# Patient Record
Sex: Female | Born: 1995 | Race: Black or African American | Hispanic: No | Marital: Single | Smoking: Never smoker
Health system: Southern US, Community
[De-identification: ages and names within clinical notes are randomized; demographics above are authoritative.]

## PROBLEM LIST (undated history)

## (undated) ENCOUNTER — Inpatient Hospital Stay (HOSPITAL_COMMUNITY): Payer: Self-pay

## (undated) DIAGNOSIS — B999 Unspecified infectious disease: Secondary | ICD-10-CM

## (undated) DIAGNOSIS — R112 Nausea with vomiting, unspecified: Secondary | ICD-10-CM

## (undated) DIAGNOSIS — Z9889 Other specified postprocedural states: Secondary | ICD-10-CM

## (undated) HISTORY — PX: WISDOM TOOTH EXTRACTION: SHX21

---
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
## 2014-05-31 ENCOUNTER — Emergency Department (HOSPITAL_COMMUNITY)
Admission: EM | Admit: 2014-05-31 | Discharge: 2014-05-31 | Discharge status: Absent without leave | Payer: Self-pay | Source: Home / Self Care

## 2014-08-02 ENCOUNTER — Inpatient Hospital Stay (HOSPITAL_COMMUNITY): Payer: Medicaid Other

## 2014-08-02 ENCOUNTER — Inpatient Hospital Stay (HOSPITAL_COMMUNITY)
Admission: AD | Admit: 2014-08-02 | Discharge: 2014-08-02 | Disposition: A | Discharge status: Home / Self Care | Payer: Medicaid Other | Source: Ambulatory Visit | Attending: Obstetrics & Gynecology | Admitting: Obstetrics & Gynecology

## 2014-08-02 ENCOUNTER — Encounter (HOSPITAL_COMMUNITY): Payer: Self-pay | Admitting: *Deleted

## 2014-08-02 DIAGNOSIS — Z3A01 Less than 8 weeks gestation of pregnancy: Secondary | ICD-10-CM | POA: Diagnosis not present

## 2014-08-02 DIAGNOSIS — O9989 Other specified diseases and conditions complicating pregnancy, childbirth and the puerperium: Secondary | ICD-10-CM | POA: Diagnosis not present

## 2014-08-02 DIAGNOSIS — R109 Unspecified abdominal pain: Secondary | ICD-10-CM

## 2014-08-02 DIAGNOSIS — O26899 Other specified pregnancy related conditions, unspecified trimester: Secondary | ICD-10-CM

## 2014-08-02 LAB — URINALYSIS, ROUTINE W REFLEX MICROSCOPIC
Bilirubin Urine: NEGATIVE
GLUCOSE, UA: NEGATIVE mg/dL
Hgb urine dipstick: NEGATIVE
Ketones, ur: NEGATIVE mg/dL
LEUKOCYTES UA: NEGATIVE
Nitrite: NEGATIVE
PH: 7 (ref 5.0–8.0)
Protein, ur: NEGATIVE mg/dL
SPECIFIC GRAVITY, URINE: 1.025 (ref 1.005–1.030)
Urobilinogen, UA: 0.2 mg/dL (ref 0.0–1.0)

## 2014-08-02 LAB — CBC
HCT: 34.5 % — ABNORMAL LOW (ref 36.0–46.0)
HEMOGLOBIN: 12 g/dL (ref 12.0–15.0)
MCH: 29.3 pg (ref 26.0–34.0)
MCHC: 34.8 g/dL (ref 30.0–36.0)
MCV: 84.1 fL (ref 78.0–100.0)
PLATELETS: 257 10*3/uL (ref 150–400)
RBC: 4.1 MIL/uL (ref 3.87–5.11)
RDW: 12.6 % (ref 11.5–15.5)
WBC: 6.7 10*3/uL (ref 4.0–10.5)

## 2014-08-02 LAB — WET PREP, GENITAL
TRICH WET PREP: NONE SEEN
Yeast Wet Prep HPF POC: NONE SEEN

## 2014-08-02 LAB — POCT PREGNANCY, URINE: PREG TEST UR: POSITIVE — AB

## 2014-08-02 LAB — ABO/RH: ABO/RH(D): O POS

## 2014-08-02 LAB — HIV ANTIBODY (ROUTINE TESTING W REFLEX): HIV 1&2 Ab, 4th Generation: NONREACTIVE

## 2014-08-02 LAB — HCG, QUANTITATIVE, PREGNANCY: HCG, BETA CHAIN, QUANT, S: 4816 m[IU]/mL — AB (ref <5)

## 2014-08-02 MED ORDER — PRENATAL PLUS 27-1 MG PO TABS: 1.0000 | ORAL_TABLET | Freq: Every day | ORAL | Status: DC

## 2014-08-02 NOTE — MAU Note (Signed)
Patient states her last Depo injection was in June, last period was 9-20. Has been having abdominal cramping for about one week. Slight vaginal discharge, no bleeding, nausea or vomiting.

## 2014-08-02 NOTE — MAU Provider Note (Signed)
Chief Complaint: Abdominal Cramping; Possible Pregnancy; and Vaginal Discharge  Initial contact at 1700.   SUBJECTIVE HPI: Rachel Aguirre is a 18 y.o. G1 at 10612w2d by LMP who presents with 2 wk hx of intermittent suprapubic abdominal cramping pain. Pain is like menstrual cramping. Not having pain now. No vaginal bleeding. Denies dysuria, urgency heamturia. No irritative vaginal discharge.   History reviewed. No pertinent past medical history. OB History  No data available   History reviewed. No pertinent past surgical history. History   Social History  . Marital Status: Single    Spouse Name: N/A    Number of Children: N/A  . Years of Education: N/A   Occupational History  . Not on file.   Social History Main Topics  . Smoking status: Never Smoker   . Smokeless tobacco: Not on file  . Alcohol Use: No  . Drug Use: No  . Sexual Activity: Yes    Birth Control/ Protection: None   Other Topics Concern  . Not on file   Social History Narrative   No current facility-administered medications on file prior to encounter.   Current Outpatient Prescriptions on File Prior to Encounter  Medication Sig Dispense Refill  . ibuprofen (ADVIL,MOTRIN) 600 MG tablet Take 1 tablet (600 mg total) by mouth every 6 (six) hours as needed for pain. 20 tablet 0   No Known Allergies  ROS: Pertinent items in HPI  OBJECTIVE Blood pressure 118/66, pulse 94, temperature 98.6 F (37 C), temperature source Oral, resp. rate 16, height 5\' 5"  (1.651 m), weight 76.658 kg (169 lb), last menstrual period 06/12/2014, SpO2 100 %. GENERAL: Well-developed, well-nourished female in no acute distress.  HEENT: Normocephalic HEART: normal rate RESP: normal effort ABDOMEN: Soft, non-tender EXTREMITIES: Nontender, no edema NEURO: Alert and oriented SPECULUM EXAM: NEFG, physiologic discharge, no blood noted, cervix clean BIMANUAL: cervix L/C; uterus retroverted, no adnexal tenderness or masses  LAB  RESULTS Results for orders placed or performed during the hospital encounter of 08/02/14 (from the past 24 hour(s))  Pregnancy, urine POC     Status: Abnormal   Collection Time: 08/02/14  4:56 PM  Result Value Ref Range   Preg Test, Ur POSITIVE (A) NEGATIVE    IMAGING No results found. [place] Ob Comp Less 14 Wks  08/02/2014   CLINICAL DATA:  Abdominal pain, flank pain  EXAM: OBSTETRIC <14 WK US AND TRANSVAGINAL OB US  TECHNIQUE: Both transabdominal and transvaginal ultrasound examinations were performed for complete evaluation of the gestation as well as the maternal uterus, adnexal regions, and pelvic cul-de-sac. Transvaginal technique was performed to assess early pregnancy.  COMPARISON:  None.  FINDINGS: Intrauterine gestational sac: Visualized/normal in shape.  Yolk sac:  Visualized.  Embryo:  Not visualized  SD: 7.2 mm   5 w   3  d   US EDC: 04/01/2015  Maternal uterus/adnexae: There is a small subchorionic hemorrhage. Bilateral ovaries are normal in size and echogenicity. The right ovary measures 3.8 x 2.9 x 2.8 cm. The left ovary measures 3.7 x 2.3 x 1.5 cm. There is a small amount of pelvic free fluid.  IMPRESSION: 1. Intrauterine gestational sac dating pregnancy at 5 weeks 3 days. No fetal pole identified. Probable early intrauterine gestational sac, but no fetal pole, or cardiac activity yet visualized. Recommend follow-up quantitative B-HCG levels and follow-up US in 14 days to confirm and assess viability. This recommendation follows SRU consensus guidelines: Diagnostic Criteria for Nonviable Pregnancy Early in the First Trimester. N Engl J  Med 2013; 161:0960-45369:1443-51. 2. Small subchorionic hemorrhage. Attention on follow-up examination is recommended.   Electronically Signed   By: Elige KoHetal  Patel   On: 08/02/2014 18:22   [place] Ob Transvaginal  08/02/2014   CLINICAL DATA:  Abdominal pain, flank pain  EXAM: OBSTETRIC <14 WK US AND TRANSVAGINAL OB US  TECHNIQUE: Both transabdominal and transvaginal  ultrasound examinations were performed for complete evaluation of the gestation as well as the maternal uterus, adnexal regions, and pelvic cul-de-sac. Transvaginal technique was performed to assess early pregnancy.  COMPARISON:  None.  FINDINGS: Intrauterine gestational sac: Visualized/normal in shape.  Yolk sac:  Visualized.  Embryo:  Not visualized  SD: 7.2 mm   5 w   3  d   US EDC: 04/01/2015  Maternal uterus/adnexae: There is a small subchorionic hemorrhage. Bilateral ovaries are normal in size and echogenicity. The right ovary measures 3.8 x 2.9 x 2.8 cm. The left ovary measures 3.7 x 2.3 x 1.5 cm. There is a small amount of pelvic free fluid.  IMPRESSION: 1. Intrauterine gestational sac dating pregnancy at 5 weeks 3 days. No fetal pole identified. Probable early intrauterine gestational sac, but no fetal pole, or cardiac activity yet visualized. Recommend follow-up quantitative B-HCG levels and follow-up US in 14 days to confirm and assess viability. This recommendation follows SRU consensus guidelines: Diagnostic Criteria for Nonviable Pregnancy Early in the First Trimester. Malva Limes Engl J Med 2013; 409:8119-14; 369:1443-51. 2. Small subchorionic hemorrhage. Attention on follow-up examination is recommended.   Electronically Signed   By: Elige KoHetal  Patel   On: 08/02/2014 18:22   MAU COURSE  ASSESSMENT 1. Abdominal pain affecting pregnancy   IUP, viability not yet determined  PLAN Discharge home    Medication List    STOP taking these medications        CALCIUM PO     ibuprofen 600 MG tablet  Commonly known as:  ADVIL,MOTRIN      TAKE these medications        prenatal vitamin w/FE, FA 27-1 MG Tabs tablet  Take 1 tablet by mouth daily.      Pregnancy precautions given Pregnancy verification letter, list of providers, MPW information given  Danae OrleansDeirdre C Tylesha Gibeault, CNM 08/02/2014  5:02 PM

## 2014-08-02 NOTE — Discharge Instructions (Signed)
Abdominal Pain During Pregnancy °Abdominal pain is common in pregnancy. Most of the time, it does not cause harm. There are many causes of abdominal pain. Some causes are more serious than others. Some of the causes of abdominal pain in pregnancy are easily diagnosed. Occasionally, the diagnosis takes time to understand. Other times, the cause is not determined. Abdominal pain can be a sign that something is very wrong with the pregnancy, or the pain may have nothing to do with the pregnancy at all. For this reason, always tell your health care provider if you have any abdominal discomfort. °HOME CARE INSTRUCTIONS  °Monitor your abdominal pain for any changes. The following actions may help to alleviate any discomfort you are experiencing: °· Do not have sexual intercourse or put anything in your vagina until your symptoms go away completely. °· Get plenty of rest until your pain improves. °· Drink clear fluids if you feel nauseous. Avoid solid food as long as you are uncomfortable or nauseous. °· Only take over-the-counter or prescription medicine as directed by your health care provider. °· Keep all follow-up appointments with your health care provider. °SEEK IMMEDIATE MEDICAL CARE IF: °· You are bleeding, leaking fluid, or passing tissue from the vagina. °· You have increasing pain or cramping. °· You have persistent vomiting. °· You have painful or bloody urination. °· You have a fever. °· You notice a decrease in your baby's movements. °· You have extreme weakness or feel faint. °· You have shortness of breath, with or without abdominal pain. °· You develop a severe headache with abdominal pain. °· You have abnormal vaginal discharge with abdominal pain. °· You have persistent diarrhea. °· You have abdominal pain that continues even after rest, or gets worse. °MAKE SURE YOU:  °· Understand these instructions. °· Will watch your condition. °· Will get help right away if you are not doing well or get  worse. °Document Released: 09/09/2005 Document Revised: 06/30/2013 Document Reviewed: 04/08/2013 °ExitCare® Patient Information ©2015 [employer]. This information is not intended to replace advice given to you by your health care provider. Make sure you discuss any questions you have with your health care provider. °Prenatal Care Providers °[facility]    [facility] ° Phone- [phone]     Phone: [phone] °         °[facility]                      [facility] ° @[place]     Phone: [phone] ° Phone: [phone] °        [facility] °[facility]     Phone: [phone] ° Phone: [phone]   °        [facility] °[facility] [place]                hone: [phone] ° Phone: [phone] °        [facility] °Dr. Bernard Marshall      Phone: [phone] ° Phone: [phone] °        [facility] °[facility]                Phone: [phone] ° Women’s [facility]  ° Phone:[phone]    [facility] ([place]) °         Phone: [phone] °[facility] °  Phone: [phone] ° °    ________________________________________ ° ° ° ° °To schedule your Maternity Eligibility Appointment, please call [phone].  When you arrive for your appointment you must bring the following items or information listed below.  Your appointment will be rescheduled if you do not have these items or are 15 minutes late. °If currently receiving Medicaid, you MUST bring: °1. Medicaid Card °2. Social Security Card °3. Picture ID °4. Proof of Pregnancy °5. Verification of current address if the address on Medicaid card is incorrect "postmarked mail" °If not receiving Medicaid, you MUST bring: °1. Social Security Card °2. Picture ID °3. Birth Certificate (if available) Passport or *Green Card °4. Proof of Pregnancy °5. Verification of current address "postmarked mail" for each  income presented. °6. Verification of insurance coverage, if any °7. Check stubs from each employer for the previous month (if unable to present check stub  °for each week, we will accept check stub for the first and last week ill the same month.) If you can't locate check stubs, you must bring a letter from the employer(s) and it must have the following information on letterhead, typed, in English: °o name of company °o company telephone number °o how long been with the company, if less than one month °o how much person earns per hour °o how many hours per week work °o the gross pay the person earned for the previous month °If you are 19 years old or less, you do not have to bring proof of income unless you work or live with the father of the baby and at that time we will need proof of income from you and/or the father of the baby. °Green Card recipients are eligible for Medicaid for Pregnant Women (MPW) ° ° °

## 2014-08-03 LAB — GC/CHLAMYDIA PROBE AMP
CT Probe RNA: NEGATIVE
GC Probe RNA: NEGATIVE

## 2014-09-29 LAB — OB RESULTS CONSOLE GC/CHLAMYDIA
Chlamydia: NEGATIVE
GC PROBE AMP, GENITAL: NEGATIVE

## 2014-09-29 LAB — OB RESULTS CONSOLE RPR: RPR: NONREACTIVE

## 2014-09-29 LAB — OB RESULTS CONSOLE HEPATITIS B SURFACE ANTIGEN: HEP B S AG: NEGATIVE

## 2014-09-29 LAB — OB RESULTS CONSOLE ANTIBODY SCREEN: ANTIBODY SCREEN: NEGATIVE

## 2014-09-29 LAB — OB RESULTS CONSOLE RUBELLA ANTIBODY, IGM: Rubella: NON-IMMUNE/NOT IMMUNE

## 2014-09-29 LAB — OB RESULTS CONSOLE HIV ANTIBODY (ROUTINE TESTING): HIV: NONREACTIVE

## 2015-03-01 LAB — OB RESULTS CONSOLE GBS: STREP GROUP B AG: POSITIVE

## 2015-03-26 ENCOUNTER — Inpatient Hospital Stay (HOSPITAL_COMMUNITY)
Admission: AD | Admit: 2015-03-26 | Discharge: 2015-03-26 | Disposition: A | Discharge status: Home / Self Care | Payer: Medicaid Other | Source: Ambulatory Visit | Attending: Obstetrics | Admitting: Obstetrics

## 2015-03-26 ENCOUNTER — Encounter (HOSPITAL_COMMUNITY): Payer: Self-pay | Admitting: *Deleted

## 2015-03-26 DIAGNOSIS — Z3A38 38 weeks gestation of pregnancy: Secondary | ICD-10-CM | POA: Diagnosis present

## 2015-03-26 DIAGNOSIS — Z8249 Family history of ischemic heart disease and other diseases of the circulatory system: Secondary | ICD-10-CM

## 2015-03-26 DIAGNOSIS — O99824 Streptococcus B carrier state complicating childbirth: Secondary | ICD-10-CM | POA: Diagnosis present

## 2015-03-26 NOTE — MAU Note (Signed)
Pt reports contractions all day today, now about 10 minutes apart. Some pink discharge x 2 today.

## 2015-03-26 NOTE — MAU Note (Signed)
Dr. Clearance CootsHarper looking at strip as patient has had variables. Dr. Clearance CootsHarper aware that patient is now 1 cm and states that pt may go home. Strip has been reviewed by Dr. Clearance CootsHarper.

## 2015-03-26 NOTE — Discharge Instructions (Signed)
Braxton Hicks Contractions °Contractions of the uterus can occur throughout pregnancy. Contractions are not always a sign that you are in labor.  °WHAT ARE BRAXTON HICKS CONTRACTIONS?  °Contractions that occur before labor are called Braxton Hicks contractions, or false labor. Toward the end of pregnancy (32-34 weeks), these contractions can develop more often and may become more forceful. This is not true labor because these contractions do not result in opening (dilatation) and thinning of the cervix. They are sometimes difficult to tell apart from true labor because these contractions can be forceful and people have different pain tolerances. You should not feel embarrassed if you go to the hospital with false labor. Sometimes, the only way to tell if you are in true labor is for your health care provider to look for changes in the cervix. °If there are no prenatal problems or other health problems associated with the pregnancy, it is completely safe to be sent home with false labor and await the onset of true labor. °HOW CAN YOU TELL THE DIFFERENCE BETWEEN TRUE AND FALSE LABOR? °False Labor °· The contractions of false labor are usually shorter and not as hard as those of true labor.   °· The contractions are usually irregular.   °· The contractions are often felt in the front of the lower abdomen and in the groin.   °· The contractions may go away when you walk around or change positions while lying down.   °· The contractions get weaker and are shorter lasting as time goes on.   °· The contractions do not usually become progressively stronger, regular, and closer together as with true labor.   °True Labor °· Contractions in true labor last 30-70 seconds, become very regular, usually become more intense, and increase in frequency.   °· The contractions do not go away with walking.   °· The discomfort is usually felt in the top of the uterus and spreads to the lower abdomen and low back.   °· True labor can be  determined by your health care provider with an exam. This will show that the cervix is dilating and getting thinner.   °WHAT TO REMEMBER °· Keep up with your usual exercises and follow other instructions given by your health care provider.   °· Take medicines as directed by your health care provider.   °· Keep your regular prenatal appointments.   °· Eat and drink lightly if you think you are going into labor.   °· If Braxton Hicks contractions are making you uncomfortable:   °¨ Change your position from lying down or resting to walking, or from walking to resting.   °¨ Sit and rest in a tub of warm water.   °¨ Drink 2-3 glasses of water. Dehydration may cause these contractions.   °¨ Do slow and deep breathing several times an hour.   °WHEN SHOULD I SEEK IMMEDIATE MEDICAL CARE? °Seek immediate medical care if: °· Your contractions become stronger, more regular, and closer together.   °· You have fluid leaking or gushing from your vagina.   °· You have a fever.   °· You pass blood-tinged mucus.   °· You have vaginal bleeding.   °· You have continuous abdominal pain.   °· You have low back pain that you never had before.   °· You feel your baby's head pushing down and causing pelvic pressure.   °· Your baby is not moving as much as it used to.   °Document Released: 09/09/2005 Document Revised: 09/14/2013 Document Reviewed: 06/21/2013 °ExitCare® Patient Information ©2015 [employer]. This information is not intended to replace advice given to you by your health care   provider. Make sure you discuss any questions you have with your health care provider. ° °

## 2015-03-27 ENCOUNTER — Inpatient Hospital Stay (HOSPITAL_COMMUNITY): Payer: Medicaid Other | Admitting: Anesthesiology

## 2015-03-27 ENCOUNTER — Encounter (HOSPITAL_COMMUNITY)
Admission: AD | Disposition: A | Discharge status: Home / Self Care | Payer: Self-pay | Source: Ambulatory Visit | Attending: Obstetrics

## 2015-03-27 ENCOUNTER — Encounter (HOSPITAL_COMMUNITY): Payer: Self-pay | Admitting: *Deleted

## 2015-03-27 ENCOUNTER — Inpatient Hospital Stay (HOSPITAL_COMMUNITY)
Admission: AD | Admit: 2015-03-27 | Discharge: 2015-03-30 | DRG: 766 | Disposition: A | Discharge status: Home / Self Care | Payer: Medicaid Other | Source: Ambulatory Visit | Attending: Obstetrics | Admitting: Obstetrics

## 2015-03-27 DIAGNOSIS — O99824 Streptococcus B carrier state complicating childbirth: Secondary | ICD-10-CM | POA: Diagnosis present

## 2015-03-27 DIAGNOSIS — Z3A38 38 weeks gestation of pregnancy: Secondary | ICD-10-CM | POA: Diagnosis present

## 2015-03-27 DIAGNOSIS — Z8249 Family history of ischemic heart disease and other diseases of the circulatory system: Secondary | ICD-10-CM | POA: Diagnosis not present

## 2015-03-27 DIAGNOSIS — O34219 Maternal care for unspecified type scar from previous cesarean delivery: Secondary | ICD-10-CM

## 2015-03-27 LAB — CBC
HCT: 35.3 % — ABNORMAL LOW (ref 36.0–46.0)
Hemoglobin: 11.9 g/dL — ABNORMAL LOW (ref 12.0–15.0)
MCH: 28.3 pg (ref 26.0–34.0)
MCHC: 33.7 g/dL (ref 30.0–36.0)
MCV: 84 fL (ref 78.0–100.0)
PLATELETS: 173 10*3/uL (ref 150–400)
RBC: 4.2 MIL/uL (ref 3.87–5.11)
RDW: 13.8 % (ref 11.5–15.5)
WBC: 10.1 10*3/uL (ref 4.0–10.5)

## 2015-03-27 LAB — TYPE AND SCREEN
ABO/RH(D): O POS
Antibody Screen: NEGATIVE

## 2015-03-27 LAB — RPR: RPR: NONREACTIVE

## 2015-03-27 SURGERY — Surgical Case
Anesthesia: Spinal | Site: Abdomen

## 2015-03-27 MED ORDER — MENTHOL 3 MG MT LOZG: 1.0000 | LOZENGE | OROMUCOSAL | Status: DC | PRN

## 2015-03-27 MED ORDER — MEPERIDINE HCL 25 MG/ML IJ SOLN
6.2500 mg | INTRAMUSCULAR | Status: DC | PRN
Start: 2015-03-27 — End: 2015-03-27

## 2015-03-27 MED ORDER — OXYTOCIN BOLUS FROM INFUSION: 500.0000 mL | INTRAVENOUS | Status: DC

## 2015-03-27 MED ORDER — OXYCODONE-ACETAMINOPHEN 5-325 MG PO TABS: 1.0000 | ORAL_TABLET | ORAL | Status: DC | PRN

## 2015-03-27 MED ORDER — NALBUPHINE HCL 10 MG/ML IJ SOLN
10.0000 mg | Freq: Once | INTRAMUSCULAR | Status: AC
  Administered 2015-03-27: 10 mg via INTRAMUSCULAR
  Filled 2015-03-27: qty 1

## 2015-03-27 MED ORDER — KETOROLAC TROMETHAMINE 30 MG/ML IJ SOLN
INTRAMUSCULAR | Status: AC
  Filled 2015-03-27: qty 1

## 2015-03-27 MED ORDER — TERBUTALINE SULFATE 1 MG/ML IJ SOLN
0.2500 mg | Freq: Once | INTRAMUSCULAR | Status: AC
  Administered 2015-03-27: 0.25 mg via SUBCUTANEOUS

## 2015-03-27 MED ORDER — TERBUTALINE SULFATE 1 MG/ML IJ SOLN
INTRAMUSCULAR | Status: AC
  Filled 2015-03-27: qty 1

## 2015-03-27 MED ORDER — DIPHENHYDRAMINE HCL 25 MG PO CAPS: 25.0000 mg | ORAL_CAPSULE | ORAL | Status: DC | PRN

## 2015-03-27 MED ORDER — BUPIVACAINE IN DEXTROSE 0.75-8.25 % IT SOLN
INTRATHECAL | Status: DC | PRN
  Administered 2015-03-27: 12 mg via INTRATHECAL

## 2015-03-27 MED ORDER — SIMETHICONE 80 MG PO CHEW: 80.0000 mg | CHEWABLE_TABLET | ORAL | Status: DC | PRN

## 2015-03-27 MED ORDER — ONDANSETRON HCL 4 MG/2ML IJ SOLN
INTRAMUSCULAR | Status: AC
  Filled 2015-03-27: qty 2

## 2015-03-27 MED ORDER — OXYTOCIN 40 UNITS IN LACTATED RINGERS INFUSION - SIMPLE MED: 62.5000 mL/h | INTRAVENOUS | Status: AC

## 2015-03-27 MED ORDER — IBUPROFEN 600 MG PO TABS
600.0000 mg | ORAL_TABLET | Freq: Four times a day (QID) | ORAL | Status: DC
  Administered 2015-03-27 – 2015-03-30 (×12): 600 mg via ORAL
  Filled 2015-03-27 (×12): qty 1

## 2015-03-27 MED ORDER — SIMETHICONE 80 MG PO CHEW
80.0000 mg | CHEWABLE_TABLET | ORAL | Status: DC
  Administered 2015-03-28 – 2015-03-30 (×3): 80 mg via ORAL
  Filled 2015-03-27 (×3): qty 1

## 2015-03-27 MED ORDER — NALBUPHINE HCL 10 MG/ML IJ SOLN: 5.0000 mg | Freq: Once | INTRAMUSCULAR | Status: AC | PRN

## 2015-03-27 MED ORDER — DEXTROSE 5 % IV SOLN
5.0000 10*6.[IU] | Freq: Once | INTRAVENOUS | Status: AC
  Administered 2015-03-27: 5 10*6.[IU] via INTRAVENOUS
  Filled 2015-03-27: qty 5

## 2015-03-27 MED ORDER — OXYTOCIN 40 UNITS IN LACTATED RINGERS INFUSION - SIMPLE MED: 62.5000 mL/h | INTRAVENOUS | Status: DC

## 2015-03-27 MED ORDER — DIPHENHYDRAMINE HCL 50 MG/ML IJ SOLN: 12.5000 mg | INTRAMUSCULAR | Status: DC | PRN

## 2015-03-27 MED ORDER — NALOXONE HCL 0.4 MG/ML IJ SOLN: 0.4000 mg | INTRAMUSCULAR | Status: DC | PRN

## 2015-03-27 MED ORDER — SIMETHICONE 80 MG PO CHEW
80.0000 mg | CHEWABLE_TABLET | Freq: Three times a day (TID) | ORAL | Status: DC
  Administered 2015-03-27 – 2015-03-30 (×9): 80 mg via ORAL
  Filled 2015-03-27 (×9): qty 1

## 2015-03-27 MED ORDER — WITCH HAZEL-GLYCERIN EX PADS: 1.0000 "application " | MEDICATED_PAD | CUTANEOUS | Status: DC | PRN

## 2015-03-27 MED ORDER — OXYCODONE-ACETAMINOPHEN 5-325 MG PO TABS
2.0000 | ORAL_TABLET | ORAL | Status: DC | PRN
  Administered 2015-03-28 – 2015-03-30 (×7): 2 via ORAL
  Filled 2015-03-27 (×7): qty 2

## 2015-03-27 MED ORDER — NALBUPHINE HCL 10 MG/ML IJ SOLN: 5.0000 mg | INTRAMUSCULAR | Status: DC | PRN

## 2015-03-27 MED ORDER — PRENATAL MULTIVITAMIN CH
1.0000 | ORAL_TABLET | Freq: Every day | ORAL | Status: DC
  Administered 2015-03-27 – 2015-03-30 (×4): 1 via ORAL
  Filled 2015-03-27 (×4): qty 1

## 2015-03-27 MED ORDER — CEFAZOLIN SODIUM-DEXTROSE 2-3 GM-% IV SOLR
INTRAVENOUS | Status: AC
  Filled 2015-03-27: qty 50

## 2015-03-27 MED ORDER — BUPIVACAINE LIPOSOME 1.3 % IJ SUSP
20.0000 mL | Freq: Once | INTRAMUSCULAR | Status: DC
  Filled 2015-03-27: qty 20

## 2015-03-27 MED ORDER — LACTATED RINGERS IV SOLN
INTRAVENOUS | Status: DC
  Administered 2015-03-27 – 2015-03-28 (×2): via INTRAVENOUS

## 2015-03-27 MED ORDER — IBUPROFEN 600 MG PO TABS: 600.0000 mg | ORAL_TABLET | Freq: Four times a day (QID) | ORAL | Status: DC

## 2015-03-27 MED ORDER — DIPHENHYDRAMINE HCL 25 MG PO CAPS: 25.0000 mg | ORAL_CAPSULE | Freq: Four times a day (QID) | ORAL | Status: DC | PRN

## 2015-03-27 MED ORDER — TETANUS-DIPHTH-ACELL PERTUSSIS 5-2.5-18.5 LF-MCG/0.5 IM SUSP: 0.5000 mL | Freq: Once | INTRAMUSCULAR | Status: DC

## 2015-03-27 MED ORDER — OXYCODONE-ACETAMINOPHEN 5-325 MG PO TABS
1.0000 | ORAL_TABLET | ORAL | Status: DC | PRN
  Administered 2015-03-30: 1 via ORAL
  Filled 2015-03-27: qty 1

## 2015-03-27 MED ORDER — SCOPOLAMINE 1 MG/3DAYS TD PT72
1.0000 | MEDICATED_PATCH | Freq: Once | TRANSDERMAL | Status: AC
  Administered 2015-03-27: 1.5 mg via TRANSDERMAL

## 2015-03-27 MED ORDER — KETOROLAC TROMETHAMINE 30 MG/ML IJ SOLN
30.0000 mg | Freq: Four times a day (QID) | INTRAMUSCULAR | Status: DC | PRN
  Administered 2015-03-27: 30 mg via INTRAMUSCULAR

## 2015-03-27 MED ORDER — PROMETHAZINE HCL 25 MG/ML IJ SOLN
25.0000 mg | Freq: Once | INTRAMUSCULAR | Status: AC
  Administered 2015-03-27: 25 mg via INTRAMUSCULAR
  Filled 2015-03-27: qty 1

## 2015-03-27 MED ORDER — FENTANYL CITRATE (PF) 100 MCG/2ML IJ SOLN
INTRAMUSCULAR | Status: DC | PRN
  Administered 2015-03-27: 20 ug via INTRATHECAL

## 2015-03-27 MED ORDER — LANOLIN HYDROUS EX OINT: 1.0000 "application " | TOPICAL_OINTMENT | CUTANEOUS | Status: DC | PRN

## 2015-03-27 MED ORDER — CITRIC ACID-SODIUM CITRATE 334-500 MG/5ML PO SOLN
30.0000 mL | ORAL | Status: DC | PRN
  Administered 2015-03-27: 30 mL via ORAL
  Filled 2015-03-27: qty 15

## 2015-03-27 MED ORDER — ACETAMINOPHEN 325 MG PO TABS: 650.0000 mg | ORAL_TABLET | ORAL | Status: DC | PRN

## 2015-03-27 MED ORDER — PHENYLEPHRINE 8 MG IN D5W 100 ML (0.08MG/ML) PREMIX OPTIME
INJECTION | INTRAVENOUS | Status: AC
  Filled 2015-03-27: qty 100

## 2015-03-27 MED ORDER — SCOPOLAMINE 1 MG/3DAYS TD PT72
MEDICATED_PATCH | TRANSDERMAL | Status: AC
  Filled 2015-03-27: qty 1

## 2015-03-27 MED ORDER — SODIUM CHLORIDE 0.9 % IJ SOLN: 3.0000 mL | INTRAMUSCULAR | Status: DC | PRN

## 2015-03-27 MED ORDER — CEFAZOLIN SODIUM-DEXTROSE 2-3 GM-% IV SOLR
INTRAVENOUS | Status: DC | PRN
  Administered 2015-03-27: 2 g via INTRAVENOUS

## 2015-03-27 MED ORDER — ACETAMINOPHEN 325 MG PO TABS
650.0000 mg | ORAL_TABLET | ORAL | Status: DC | PRN
  Administered 2015-03-27: 650 mg via ORAL
  Filled 2015-03-27: qty 2

## 2015-03-27 MED ORDER — FENTANYL CITRATE (PF) 100 MCG/2ML IJ SOLN
INTRAMUSCULAR | Status: AC
  Filled 2015-03-27: qty 2

## 2015-03-27 MED ORDER — ONDANSETRON HCL 4 MG/2ML IJ SOLN: 4.0000 mg | Freq: Three times a day (TID) | INTRAMUSCULAR | Status: DC | PRN

## 2015-03-27 MED ORDER — ONDANSETRON HCL 4 MG/2ML IJ SOLN
4.0000 mg | Freq: Four times a day (QID) | INTRAMUSCULAR | Status: DC | PRN
  Administered 2015-03-27: 4 mg via INTRAVENOUS

## 2015-03-27 MED ORDER — FLEET ENEMA 7-19 GM/118ML RE ENEM: 1.0000 | ENEMA | RECTAL | Status: DC | PRN

## 2015-03-27 MED ORDER — SODIUM CHLORIDE 0.9 % IJ SOLN
INTRAMUSCULAR | Status: AC
  Filled 2015-03-27: qty 20

## 2015-03-27 MED ORDER — PENICILLIN G POTASSIUM 5000000 UNITS IJ SOLR
2.5000 10*6.[IU] | INTRAVENOUS | Status: DC
  Filled 2015-03-27 (×3): qty 2.5

## 2015-03-27 MED ORDER — PHENYLEPHRINE 8 MG IN D5W 100 ML (0.08MG/ML) PREMIX OPTIME
INJECTION | INTRAVENOUS | Status: DC | PRN
Start: 2015-03-27 — End: 2015-03-27
  Administered 2015-03-27: 60 ug/min via INTRAVENOUS

## 2015-03-27 MED ORDER — DIBUCAINE 1 % RE OINT: 1.0000 "application " | TOPICAL_OINTMENT | RECTAL | Status: DC | PRN

## 2015-03-27 MED ORDER — MORPHINE SULFATE (PF) 0.5 MG/ML IJ SOLN
INTRAMUSCULAR | Status: DC | PRN
  Administered 2015-03-27: .2 mg via INTRATHECAL

## 2015-03-27 MED ORDER — NALOXONE HCL 1 MG/ML IJ SOLN
1.0000 ug/kg/h | INTRAVENOUS | Status: DC | PRN
  Filled 2015-03-27: qty 2

## 2015-03-27 MED ORDER — LIDOCAINE HCL (PF) 1 % IJ SOLN
30.0000 mL | INTRAMUSCULAR | Status: DC | PRN
  Filled 2015-03-27: qty 30

## 2015-03-27 MED ORDER — ERYTHROMYCIN 5 MG/GM OP OINT
TOPICAL_OINTMENT | OPHTHALMIC | Status: AC
  Filled 2015-03-27: qty 1

## 2015-03-27 MED ORDER — OXYCODONE-ACETAMINOPHEN 5-325 MG PO TABS: 2.0000 | ORAL_TABLET | ORAL | Status: DC | PRN

## 2015-03-27 MED ORDER — LACTATED RINGERS IV SOLN
500.0000 mL | INTRAVENOUS | Status: DC | PRN
  Administered 2015-03-27: 500 mL via INTRAVENOUS

## 2015-03-27 MED ORDER — LACTATED RINGERS IV SOLN
INTRAVENOUS | Status: DC
  Administered 2015-03-27 (×4): via INTRAVENOUS

## 2015-03-27 MED ORDER — OXYTOCIN 10 UNIT/ML IJ SOLN
40.0000 [IU] | INTRAVENOUS | Status: DC | PRN
  Administered 2015-03-27: 40 [IU] via INTRAVENOUS

## 2015-03-27 MED ORDER — OXYTOCIN 10 UNIT/ML IJ SOLN
INTRAMUSCULAR | Status: AC
  Filled 2015-03-27: qty 4

## 2015-03-27 MED ORDER — NALBUPHINE HCL 10 MG/ML IJ SOLN
10.0000 mg | Freq: Once | INTRAMUSCULAR | Status: AC
  Administered 2015-03-27: 10 mg via INTRAVENOUS
  Filled 2015-03-27: qty 1

## 2015-03-27 MED ORDER — FENTANYL CITRATE (PF) 100 MCG/2ML IJ SOLN: 25.0000 ug | INTRAMUSCULAR | Status: DC | PRN

## 2015-03-27 MED ORDER — MORPHINE SULFATE 0.5 MG/ML IJ SOLN
INTRAMUSCULAR | Status: AC
  Filled 2015-03-27: qty 10

## 2015-03-27 MED ORDER — KETOROLAC TROMETHAMINE 30 MG/ML IJ SOLN: 30.0000 mg | Freq: Four times a day (QID) | INTRAMUSCULAR | Status: DC | PRN

## 2015-03-27 MED ORDER — SENNOSIDES-DOCUSATE SODIUM 8.6-50 MG PO TABS
2.0000 | ORAL_TABLET | ORAL | Status: DC
  Administered 2015-03-28 – 2015-03-30 (×3): 2 via ORAL
  Filled 2015-03-27 (×3): qty 2

## 2015-03-27 MED ORDER — ZOLPIDEM TARTRATE 5 MG PO TABS: 5.0000 mg | ORAL_TABLET | Freq: Every evening | ORAL | Status: DC | PRN

## 2015-03-27 SURGICAL SUPPLY — 34 items
CLAMP CORD UMBIL (MISCELLANEOUS) IMPLANT
CLOTH BEACON ORANGE TIMEOUT ST (SAFETY) ×3 IMPLANT
CONTAINER PREFILL 10% NBF 15ML (MISCELLANEOUS) ×6 IMPLANT
DRAPE SHEET LG 3/4 BI-LAMINATE (DRAPES) IMPLANT
DRSG OPSITE POSTOP 4X10 (GAUZE/BANDAGES/DRESSINGS) ×3 IMPLANT
DURAPREP 26ML APPLICATOR (WOUND CARE) ×3 IMPLANT
ELECT REM PT RETURN 9FT ADLT (ELECTROSURGICAL) ×3
ELECTRODE REM PT RTRN 9FT ADLT (ELECTROSURGICAL) ×1 IMPLANT
EXTRACTOR VACUUM M CUP 4 TUBE (SUCTIONS) IMPLANT
EXTRACTOR VACUUM M CUP 4' TUBE (SUCTIONS)
GLOVE BIO SURGEON STRL SZ8 (GLOVE) ×3 IMPLANT
GOWN STRL REUS W/TWL LRG LVL3 (GOWN DISPOSABLE) ×6 IMPLANT
KIT ABG SYR 3ML LUER SLIP (SYRINGE) ×3 IMPLANT
LIQUID BAND (GAUZE/BANDAGES/DRESSINGS) ×3 IMPLANT
NEEDLE HYPO 22GX1.5 SAFETY (NEEDLE) ×3 IMPLANT
NEEDLE HYPO 25X5/8 SAFETYGLIDE (NEEDLE) ×6 IMPLANT
NS IRRIG 1000ML POUR BTL (IV SOLUTION) ×3 IMPLANT
PACK C SECTION WH (CUSTOM PROCEDURE TRAY) ×3 IMPLANT
PAD OB MATERNITY 4.3X12.25 (PERSONAL CARE ITEMS) ×3 IMPLANT
RTRCTR C-SECT PINK 25CM LRG (MISCELLANEOUS) ×3 IMPLANT
STAPLER VISISTAT 35W (STAPLE) ×3 IMPLANT
SUT GUT PLAIN 0 CT-3 TAN 27 (SUTURE) IMPLANT
SUT MNCRL 0 VIOLET CTX 36 (SUTURE) ×3 IMPLANT
SUT MNCRL AB 4-0 PS2 18 (SUTURE) IMPLANT
SUT MON AB 2-0 CT1 27 (SUTURE) ×3 IMPLANT
SUT MON AB 3-0 SH 27 (SUTURE)
SUT MON AB 3-0 SH27 (SUTURE) IMPLANT
SUT MONOCRYL 0 CTX 36 (SUTURE) ×6
SUT PLAIN 2 0 XLH (SUTURE) IMPLANT
SUT VIC AB 0 CTX 36 (SUTURE) ×4
SUT VIC AB 0 CTX36XBRD ANBCTRL (SUTURE) ×2 IMPLANT
SYR CONTROL 10ML LL (SYRINGE) ×3 IMPLANT
TOWEL OR 17X24 6PK STRL BLUE (TOWEL DISPOSABLE) ×3 IMPLANT
TRAY FOLEY CATH SILVER 14FR (SET/KITS/TRAYS/PACK) ×3 IMPLANT

## 2015-03-27 NOTE — H&P (Signed)
Rachel Aguirre is a 19 y.o. female presenting for rupture of membranes. Maternal Medical History:  Reason for admission: Rupture of membranes and contractions.   Contractions: Frequency: regular.   Perceived severity is mild.    Fetal activity: Perceived fetal activity is normal.   Last perceived fetal movement was within the past hour.    Prenatal complications: no prenatal complications Prenatal Complications - Diabetes: none.    OB History    Gravida Para Term Preterm AB TAB SAB Ectopic Multiple Living   1              History reviewed. No pertinent past medical history. History reviewed. No pertinent past surgical history. Family History: family history includes Hypertension in her mother. Social History:  reports that she has never smoked. She does not have any smokeless tobacco history on file. She reports that she does not drink alcohol or use illicit drugs.   Prenatal Transfer Tool  Maternal Diabetes: No Genetic Screening: Normal Maternal Ultrasounds/Referrals: Normal Fetal Ultrasounds or other Referrals:  None Maternal Substance Abuse:  No Significant Maternal Medications:  None Significant Maternal Lab Results:  Lab values include: Group B Strep positive Other Comments:  None  Review of Systems  All other systems reviewed and are negative.   Dilation: 1.5 Effacement (%): 50 Station: -3 Exam by:: Building control surveyorDenise Collison RN Blood pressure 141/80, pulse 88, temperature 97.8 F (36.6 C), temperature source Oral, resp. rate 18, last menstrual period 06/12/2014. Maternal Exam:  Abdomen: Patient reports no abdominal tenderness. Fetal presentation: vertex  Pelvis: adequate for delivery.   Cervix: Cervix evaluated by digital exam.     Physical Exam  Nursing note and vitals reviewed. Constitutional: She is oriented to person, place, and time. She appears well-developed and well-nourished.  HENT:  Head: Normocephalic and atraumatic.  Eyes: Conjunctivae are normal.  Pupils are equal, round, and reactive to light.  Neck: Normal range of motion. Neck supple.  Cardiovascular: Normal rate and regular rhythm.   Respiratory: Effort normal.  GI: Soft.  Genitourinary: Vagina normal and uterus normal.  Musculoskeletal: Normal range of motion.  Neurological: She is alert and oriented to person, place, and time.  Skin: Skin is warm and dry.  Psychiatric: She has a normal mood and affect. Her behavior is normal. Judgment and thought content normal.    Prenatal labs: ABO, Rh: --/--/O POS (07/04 0340) Antibody: NEG (07/04 0340) Rubella: Nonimmune (01/07 0000) RPR: Nonreactive (01/07 0000)  HBsAg: Negative (01/07 0000)  HIV: Non-reactive (01/07 0000)  GBS: Positive (06/08 0000)   Assessment/Plan: 38.5 weeks.  SROM.  Admit.  Pitocin prn.   HARPER,CHARLES A 03/27/2015, 5:04 AM

## 2015-03-27 NOTE — Anesthesia Postprocedure Evaluation (Signed)
  Anesthesia Post Note  Patient: Rachel Aguirre  Procedure(s) Performed: Procedure(s) (LRB): CESAREAN SECTION (N/A)  Anesthesia type: Spinal  Patient location: Mother/Baby  Post pain: Pain level controlled  Post assessment: Post-op Vital signs reviewed  Last Vitals:  Filed Vitals:   03/27/15 1452  BP: 107/82  Pulse: 91  Temp:   Resp:     Post vital signs: Reviewed  Level of consciousness: awake  Complications: No apparent anesthesia complications

## 2015-03-27 NOTE — Transfer of Care (Signed)
Immediate Anesthesia Transfer of Care Note  Patient: Rachel Aguirre  Procedure(s) Performed: Procedure(s): CESAREAN SECTION (N/A)  Patient Location: PACU  Anesthesia Type:Spinal  Level of Consciousness: awake, alert  and oriented  Airway & Oxygen Therapy: Patient Spontanous Breathing  Post-op Assessment: Report given to RN  Post vital signs: Reviewed  Last Vitals:  Filed Vitals:   03/27/15 0620  BP:   Pulse:   Temp: 36.9 C  Resp:     Complications: No apparent anesthesia complications

## 2015-03-27 NOTE — Progress Notes (Signed)
Rachel Aguirre is a 19 y.o. G1P0 at 24105w5d by LMP admitted for rupture of membranes  Subjective:   Objective: BP 141/80 mmHg  Pulse 90  Temp(Src) 98.5 F (36.9 C) (Oral)  Resp 18  Ht 5\' 3"  (1.6 m)  Wt 193 lb (87.544 kg)  BMI 34.20 kg/m2  LMP 06/12/2014      FHT:  FHR: 120 bpm, variability: moderate,  accelerations:  Present,  decelerations:  Present prolonged variable  UC:   regular, every 2 minutes SVE:   Dilation: 1 Effacement (%): 50 Station: -3 Exam by:: Veronica Mensah  Labs: Lab Results  Component Value Date   WBC 10.1 03/27/2015   HGB 11.9* 03/27/2015   HCT 35.3* 03/27/2015   MCV 84.0 03/27/2015   PLT 173 03/27/2015    Assessment / Plan: 38.5 weeks.  SROM.  Repetitive prolongrd variable FHR decels.  Early latent phase.  Do not think that fetus will tolerate a long labor with an unfavorable cervix.  Will proceed with C/S for repetitive prolonged FHR decels in latent phase of labor.  Labor: Latent phase Preeclampsia:  n/a Fetal Wellbeing:  Category II Pain Control:  Labor support without medications I/D:  n/a Anticipated MOD:  C/S  Montreal Steidle A 03/27/2015, 6:45 AM

## 2015-03-27 NOTE — Op Note (Signed)
Cesarean Section Procedure Note   Rachel Aguirre   03/27/2015  Indications: Repetative prolonged FHR decels   Pre-operative Diagnosis: repetative fetal heart prolonged decels intolerance of labor .   Post-operative Diagnosis: Same   Surgeon: Nathali Vent A  Assistants: Surgical Technician  Anesthesia: spinal  Procedure Details:  The patient was seen in the Holding Room. The risks, benefits, complications, treatment options, and expected outcomes were discussed with the patient. The patient concurred with the proposed plan, giving informed consent. The patient was identified as Rachel Aguirre and the procedure verified as C-Section Delivery. A Time Out was held and the above information confirmed.  After induction of anesthesia, the patient was draped and prepped in the usual sterile manner. A transverse incision was made and carried down through the subcutaneous tissue to the fascia. The fascial incision was made and extended transversely. The fascia was separated from the underlying rectus tissue superiorly and inferiorly. The peritoneum was identified and entered. The peritoneal incision was extended longitudinally. The utero-vesical peritoneal reflection was incised transversely and the bladder flap was bluntly freed from the lower uterine segment. A low transverse uterine incision was made. Delivered from cephalic presentation was a 2970 gram living newborn female infant(s). APGAR (1 MIN): 6   APGAR (5 MINS): 9   APGAR (10 MINS):    A cord ph was not sent. The umbilical cord was clamped and cut cord. A sample was obtained for evaluation. The placenta was removed Intact and appeared normal.  The uterine incision was closed with running locked sutures of 1-0 Monocryl. A second imbricating layer of the same suture was placed.  Hemostasis was observed. The paracolic gutters were irrigated. The parieto peritoneum was closed in a running fashion with 2-0 Vicryl.  The fascia was then reapproximated  with running sutures of 0 Vicryl.  The skin was closed with staples.  Instrument, sponge, and needle counts were correct prior the abdominal closure and were correct at the conclusion of the case.    Findings:   Estimated Blood Loss: * No blood loss amount entered *   Total IV Fluids: 2400ml   Urine Output: 300CC OF clear urine  Specimens: @ORSPECIMEN @   Complications: no complications  Disposition: PACU - hemodynamically stable.  Maternal Condition: stable   Baby condition / location:  Couplet care / Skin to Skin    Signed: Surgeon(s): Brock Badharles A Allina Riches, MD

## 2015-03-27 NOTE — MAU Note (Signed)
Pt to be admited to L&D

## 2015-03-27 NOTE — MAU Note (Signed)
Pt states that she SROM for clear fluid at 0230. Pt states that baby is active and she is contracting every 3-5 minutes.

## 2015-03-27 NOTE — Anesthesia Preprocedure Evaluation (Signed)
Anesthesia Evaluation  Patient identified by MRN, date of birth, ID band Patient awake    Reviewed: Allergy & Precautions, Patient's Chart, lab work & pertinent test results  History of Anesthesia Complications Negative for: history of anesthetic complications  Airway Mallampati: III  TM Distance: >3 FB Neck ROM: Full    Dental  (+) Teeth Intact   Pulmonary neg pulmonary ROS,  breath sounds clear to auscultation        Cardiovascular negative cardio ROS  Rhythm:Regular     Neuro/Psych negative neurological ROS  negative psych ROS   GI/Hepatic negative GI ROS, Neg liver ROS,   Endo/Other  negative endocrine ROS  Renal/GU negative Renal ROS     Musculoskeletal   Abdominal   Peds  Hematology negative hematology ROS (+)   Anesthesia Other Findings   Reproductive/Obstetrics (+) Pregnancy                             Anesthesia Physical Anesthesia Plan  ASA: II  Anesthesia Plan: Spinal   Post-op Pain Management:    Induction:   Airway Management Planned: Nasal Cannula  Additional Equipment: None  Intra-op Plan:   Post-operative Plan:   Informed Consent: I have reviewed the patients History and Physical, chart, labs and discussed the procedure including the risks, benefits and alternatives for the proposed anesthesia with the patient or authorized representative who has indicated his/her understanding and acceptance.     Plan Discussed with: CRNA and Surgeon  Anesthesia Plan Comments:         Anesthesia Quick Evaluation

## 2015-03-27 NOTE — Anesthesia Postprocedure Evaluation (Signed)
  Anesthesia Post-op Note  Patient: Rachel Aguirre  Procedure(s) Performed: Procedure(s): CESAREAN SECTION (N/A)  Patient Location: PACU  Anesthesia Type:Spinal  Level of Consciousness: awake, alert  and oriented  Airway and Oxygen Therapy: Patient Spontanous Breathing  Post-op Pain: mild  Post-op Assessment: Post-op Vital signs reviewed and Patient's Cardiovascular Status Stable              Post-op Vital Signs: Reviewed and stable  Last Vitals:  Filed Vitals:   03/27/15 0845  BP: 124/64  Pulse: 78  Temp:   Resp: 26    Complications: No apparent anesthesia complications

## 2015-03-27 NOTE — Lactation Note (Signed)
This note was copied from the chart of Rachel Aguirre. Lactation Consultation Note  Initial visit made.  Breastfeeding consultation services and support information given to patient.  Baby is showing feeding cues.  Assisted with positioning baby in football hold.  Baby opens wide and latches easily to breast.  Demonstrated waking techniques and breast massage to keep baby effective.  Instructed to feed with any sign of hunger but at least every 3 hours.  Encouraged to call for assist prn.  Patient Name: Rachel Aguirre Today's Date: 03/27/2015 Reason for consult: Initial assessment   Maternal Data    Feeding Feeding Type: Breast Fed Length of feed: 10 min  LATCH Score/Interventions Latch: Grasps breast easily, tongue down, lips flanged, rhythmical sucking. Intervention(s): Adjust position;Assist with latch;Breast massage;Breast compression  Audible Swallowing: A few with stimulation Intervention(s): Skin to skin;Hand expression Intervention(s): Skin to skin  Type of Nipple: Everted at rest and after stimulation  Comfort (Breast/Nipple): Soft / non-tender     Hold (Positioning): Assistance needed to correctly position infant at breast and maintain latch. Intervention(s): Breastfeeding basics reviewed;Support Pillows;Position options;Skin to skin  LATCH Score: 8  Lactation Tools Discussed/Used     Consult Status Consult Status: Follow-up Date: 03/28/15 Follow-up type: In-patient    Huston FoleyMOULDEN, Tifani Dack S 03/27/2015, 2:42 PM

## 2015-03-28 ENCOUNTER — Encounter (HOSPITAL_COMMUNITY): Payer: Self-pay | Admitting: Obstetrics

## 2015-03-28 LAB — CBC
HCT: 32.2 % — ABNORMAL LOW (ref 36.0–46.0)
Hemoglobin: 10.6 g/dL — ABNORMAL LOW (ref 12.0–15.0)
MCH: 28 pg (ref 26.0–34.0)
MCHC: 32.9 g/dL (ref 30.0–36.0)
MCV: 85 fL (ref 78.0–100.0)
Platelets: 137 10*3/uL — ABNORMAL LOW (ref 150–400)
RBC: 3.79 MIL/uL — AB (ref 3.87–5.11)
RDW: 14 % (ref 11.5–15.5)
WBC: 11.7 10*3/uL — AB (ref 4.0–10.5)

## 2015-03-28 NOTE — Progress Notes (Signed)
Patient ID: Rachel Aguirre, female   DOB: 11-17-1995, 19 y.o.   MRN: [id number] Postop day 2 Vital signs normal Fundus firm Lochia moderate Legs negative doing well

## 2015-03-28 NOTE — Lactation Note (Addendum)
This note was copied from the chart of Rachel Aguirre. Lactation Consultation Note  Patient Name: Rachel Aguirre Today's Date: 03/28/2015 Reason for consult: Follow-up assessment Baby 33 hours old. Mom states that baby nursed within last hour for 10 minutes. Discussed with mom that it appears the baby is having a lot of short feeds. Mom states that she does think baby swallowing while at breast. Offered to assist mom to latch baby, but mom declined. Mom states that she will call for help later. Enc mom to call for her nurse to assist her to latch baby. Mom states nurse did help her with last feed as well. Enc mom to nurse with cues. Mom states that she has been able to hand express colostrum. Verified with mom that baby has had 2 voids since birth.  Maternal Data Has patient been taught Hand Expression?: Yes (Per mom.) Does the patient have breastfeeding experience prior to this delivery?: No  Feeding    LATCH Score/Interventions                      Lactation Tools Discussed/Used     Consult Status Consult Status: Follow-up Date: 03/29/15 Follow-up type: In-patient    Geralynn OchsWILLIARD, Jorgeluis Gurganus 03/28/2015, 4:51 PM

## 2015-03-29 NOTE — Lactation Note (Signed)
This note was copied from the chart of Rachel Aguirre. Lactation Consultation Note Follow up visit at 60 hours of age.  Mom reports baby is doing well with breast feedings.  Chart indicated 5 bresatf eedings under 10 minutes when asking mom to encouraged longer feedings mom reports she really eats for 20 minutes and she has been sleepy. Mom reports RN changed a void/stool around 3:00 today.  Baby has adequate stools, but needs to increase urine output.  Discussed with mom recording for accuracy.  Mom reports she has not pumped.  Encouraged mom to pump of latch at least 8 times in 24 hours to establish a milk supply.  Mom denies pain or concerns.  Baby asleep in crib mom is eating.  [facility] for weight loss tonight.  Mom to call for assist as needed and for LATCH score tonight from RN.   Patient Name: Rachel Nyilah Altic Today's Date: 03/29/2015 Reason for consult: Follow-up assessment   Maternal Data    Feeding Feeding Type: Breast Fed Length of feed: 8 min  LATCH Score/Interventions                      Lactation Tools Discussed/Used     Consult Status Consult Status: Follow-up Date: 03/30/15 Follow-up type: In-patient    Shoptaw, Arvella MerlesJana Lynn 03/29/2015, 8:12 PM

## 2015-03-29 NOTE — Progress Notes (Signed)
Patient ID: Rachel Aguirre, female   DOB: Feb 21, 1996, 19 y.o.   MRN: [id number] Postop day 3 Blood pressure 125/70 respiration 18 pulse 66 temp 98 1 No complaints Incision clean and dry Legs negative Doing well home today

## 2015-03-29 NOTE — Discharge Summary (Signed)
Obstetric Discharge Summary Reason for Admission: onset of labor Prenatal Procedures: none Intrapartum Procedures: cesarean: low cervical, transverse Postpartum Procedures: none Complications-Operative and Postpartum: none HEMOGLOBIN  Date Value Ref Range Status  03/28/2015 10.6* 12.0 - 15.0 g/dL Final   HCT  Date Value Ref Range Status  03/28/2015 32.2* 36.0 - 46.0 % Final    Physical Exam:  General: alert Lochia: appropriate Uterine Fundus: firm Incision: healing well DVT Evaluation: No evidence of DVT seen on physical exam.  Discharge Diagnoses: Term Pregnancy-delivered  Discharge Information: Date: 03/29/2015 Activity: pelvic rest Diet: routine Medications: Percocet Condition: improved Instructions: refer to practice specific booklet Discharge to: home   Newborn Data: Live born female  Birth Weight: 6 lb 8.8 oz (2970 g) APGAR: 6, 9  Home with mother.  Annel Zunker A 03/29/2015, 6:19 AM

## 2015-03-30 MED ORDER — MEASLES, MUMPS & RUBELLA VAC ~~LOC~~ INJ
0.5000 mL | INJECTION | Freq: Once | SUBCUTANEOUS | Status: AC
  Administered 2015-03-30: 0.5 mL via SUBCUTANEOUS
  Filled 2015-03-30: qty 0.5

## 2015-03-30 NOTE — Discharge Instructions (Signed)
Discharge instructions   You can wash your hair  Shower  Eat what you want  Drink what you want  See me in 6 weeks  Your ankles are going to swell more in the next 2 weeks than when pregnant  No sex for 6 weeks   Kailen Hinkle A, MD 03/29/2015  Discharge instructions   You can wash your hair  Shower  Eat what you want  Drink what you want  See me in 6 weeks  Your ankles are going to swell more in the next 2 weeks than when pregnant  No sex for 6 weeks   Ferron Ishmael A, MD 03/30/2015

## 2015-03-30 NOTE — Lactation Note (Signed)
This note was copied from the chart of Rachel Saraya Noone. Lactation Consultation Note  Discussed the importance of putting the baby to the breast often to establish her milk supply. Briefly reviewed supply and demand. Suggest she breastfeed first and then give formula supplement if baby still hungry after. Mother's breasts are filling.  Suggest she apply ice packs if she becomes engorged. Provided volume guidelines.  Encouraged mother to call if she needs assistance with latching. Mother states "baby latches fine".   Patient Name: Rachel Aguirre Today's Date: 03/30/2015 Reason for consult: Follow-up assessment   Maternal Data    Feeding Feeding Type: Bottle Fed - Formula  LATCH Score/Interventions                      Lactation Tools Discussed/Used     Consult Status Consult Status: Complete    Hardie PulleyBerkelhammer, Ruth Boschen 03/30/2015, 11:24 AM

## 2015-03-30 NOTE — Progress Notes (Signed)
Patient ID: [name], female   DOB: [birth year], 19 y.o.   MRN: [id number] Postop day 3 Blood pressure 10/19/1975 respiration 18 pulse 84 Abdomen soft dressing dry Legs negative home today

## 2015-09-19 ENCOUNTER — Inpatient Hospital Stay (HOSPITAL_COMMUNITY)
Admission: AD | Admit: 2015-09-19 | Discharge: 2015-09-19 | Disposition: A | Discharge status: Home / Self Care | Payer: Medicaid Other | Source: Ambulatory Visit | Attending: Family Medicine | Admitting: Family Medicine

## 2015-09-19 ENCOUNTER — Encounter (HOSPITAL_COMMUNITY): Payer: Self-pay | Admitting: Advanced Practice Midwife

## 2015-09-19 DIAGNOSIS — N911 Secondary amenorrhea: Secondary | ICD-10-CM | POA: Insufficient documentation

## 2015-09-19 DIAGNOSIS — Z3201 Encounter for pregnancy test, result positive: Secondary | ICD-10-CM | POA: Insufficient documentation

## 2015-09-19 LAB — URINALYSIS, ROUTINE W REFLEX MICROSCOPIC
BILIRUBIN URINE: NEGATIVE
GLUCOSE, UA: NEGATIVE mg/dL
Hgb urine dipstick: NEGATIVE
Ketones, ur: NEGATIVE mg/dL
Leukocytes, UA: NEGATIVE
Nitrite: NEGATIVE
PH: 7.5 (ref 5.0–8.0)
Protein, ur: NEGATIVE mg/dL
Specific Gravity, Urine: 1.02 (ref 1.005–1.030)

## 2015-09-19 LAB — POCT PREGNANCY, URINE: Preg Test, Ur: POSITIVE — AB

## 2015-09-19 MED ORDER — CONCEPT OB 130-92.4-1 MG PO CAPS: 1.0000 | ORAL_CAPSULE | Freq: Every day | ORAL | Status: DC

## 2015-09-19 NOTE — MAU Note (Signed)
+   HPT 1 month ago. Wants to see "how far along I am." Denies any problems.

## 2015-09-19 NOTE — MAU Provider Note (Signed)
[name] is a 19 y.o. G2P1001 at 245w0d who presents to MAU today for pregnancy verification. The patient denies abdominal pain or vaginal bleeding today.   BP 114/63 mmHg  Pulse 74  Temp(Src) 98.6 F (37 C) (Oral)  Resp 18  Ht 5\' 2"  (1.575 m)  Wt 174 lb (78.926 kg)  BMI 31.82 kg/m2  LMP 06/13/2015  CONSTITUTIONAL: Well-developed, well-nourished female in no acute distress.  CARDIOVASCULAR: Regular heart rate RESPIRATORY: Normal effort NEUROLOGICAL: Alert and oriented to person, place, and time.  SKIN: Not diaphoretic. No erythema. No pallor. PSYCH: Normal mood and affect. Normal behavior. Normal judgment and thought content.  Results for orders placed or performed during the hospital encounter of 09/19/15 (from the past 24 hour(s))  Pregnancy, urine POC     Status: Abnormal   Collection Time: 09/19/15 12:18 PM  Result Value Ref Range   Preg Test, Ur POSITIVE (A) NEGATIVE    A: Positive pregnancy test Secondary Amenorrhea  P: Discharge home Pregnancy confirmation letter and list of area OB providers given Patient advised to start taking prenatal vitamins First trimester warning signs reviewed Patient may return to MAU as needed or if her condition were to change or worsen   [place] Yaa Donnell[place], CNM  09/19/2015 12:38 PM

## 2015-09-19 NOTE — Discharge Instructions (Signed)
Prenatal Care °WHAT IS PRENATAL CARE?  °Prenatal care is the process of caring for a pregnant woman before she gives birth. Prenatal care makes sure that she and her baby remain as healthy as possible throughout pregnancy. Prenatal care may be provided by a midwife, family practice health care provider, or a childbirth and pregnancy specialist (obstetrician). Prenatal care may include physical examinations, testing, treatments, and education on nutrition, lifestyle, and social support services. °WHY IS PRENATAL CARE SO IMPORTANT?  °Early and consistent prenatal care increases the chance that you and your baby will remain healthy throughout your pregnancy. This type of care also decreases a baby's risk of being born too early (prematurely), or being born smaller than expected (small for gestational age). Any underlying medical conditions you may have that could pose a risk during your pregnancy are discussed during prenatal care visits. You will also be monitored regularly for any new conditions that may arise during your pregnancy so they can be treated quickly and effectively. °WHAT HAPPENS DURING PRENATAL CARE VISITS? °Prenatal care visits may include the following: °Discussion °Tell your health care provider about any new signs or symptoms you have experienced since your last visit. These might include: °· Nausea or vomiting. °· Increased or decreased level of energy. °· Difficulty sleeping. °· Back or leg pain. °· Weight changes. °· Frequent urination. °· Shortness of breath with physical activity. °· Changes in your skin, such as the development of a rash or itchiness. °· Vaginal discharge or bleeding. °· Feelings of excitement or nervousness. °· Changes in your baby's movements. °You may want to write down any questions or topics you want to discuss with your health care provider and bring them with you to your appointment. °Examination °During your first prenatal care visit, you will likely have a complete  physical exam. Your health care provider will often examine your vagina, cervix, and the position of your uterus, as well as check your heart, lungs, and other body systems. As your pregnancy progresses, your health care provider will measure the size of your uterus and your baby's position inside your uterus. He or she may also examine you for early signs of labor. Your prenatal visits may also include checking your blood pressure and, after about 10-12 weeks of pregnancy, listening to your baby's heartbeat. °Testing °Regular testing often includes: °· Urinalysis. This checks your urine for glucose, protein, or signs of infection. °· Blood count. This checks the levels of white and red blood cells in your body. °· Tests for sexually transmitted infections (STIs). Testing for STIs at the beginning of pregnancy is routinely done and is required in many states. °· Antibody testing. You will be checked to see if you are immune to certain illnesses, such as rubella, that can affect a developing fetus. °· Glucose screen. Around 24-28 weeks of pregnancy, your blood glucose level will be checked for signs of gestational diabetes. Follow-up tests may be recommended. °· Group B strep. This is a bacteria that is commonly found inside a woman's vagina. This test will inform your health care provider if you need an antibiotic to reduce the amount of this bacteria in your body prior to labor and childbirth. °· Ultrasound. Many pregnant women undergo an ultrasound screening around 18-20 weeks of pregnancy to evaluate the health of the fetus and check for any developmental abnormalities. °· HIV (human immunodeficiency virus) testing. Early in your pregnancy, you will be screened for HIV. If you are at high risk for HIV, this test   may be repeated during your third trimester of pregnancy. °You may be offered other testing based on your age, personal or family medical history, or other factors.  °HOW OFTEN SHOULD I PLAN TO SEE MY  HEALTH CARE PROVIDER FOR PRENATAL CARE? °Your prenatal care check-up schedule depends on any medical conditions you have before, or develop during, your pregnancy. If you do not have any underlying medical conditions, you will likely be seen for checkups: °· Monthly, during the first 6 months of pregnancy. °· Twice a month during months 7 and 8 of pregnancy. °· Weekly starting in the 9th month of pregnancy and until delivery. °If you develop signs of early labor or other concerning signs or symptoms, you may need to see your health care provider more often. Ask your health care provider what prenatal care schedule is best for you. °WHAT CAN I DO TO KEEP MYSELF AND MY BABY AS HEALTHY AS POSSIBLE DURING MY PREGNANCY? °· Take a prenatal vitamin containing 400 micrograms (0.4 mg) of folic acid every day. Your health care provider may also ask you to take additional vitamins such as iodine, vitamin D, iron, copper, and zinc. °· Take 1500-2000 mg of calcium daily starting at your 20th week of pregnancy until you deliver your baby. °· Make sure you are up to date on your vaccinations. Unless directed otherwise by your health care provider: °¨ You should receive a tetanus, diphtheria, and pertussis (Tdap) vaccination between the 27th and 36th week of your pregnancy, regardless of when your last Tdap immunization occurred. This helps protect your baby from whooping cough (pertussis) after he or she is born. °¨ You should receive an annual inactivated influenza vaccine (IIV) to help protect you and your baby from influenza. This can be done at any point during your pregnancy. °· Eat a well-rounded diet that includes: °¨ Fresh fruits and vegetables. °¨ Lean proteins. °¨ Calcium-rich foods such as milk, yogurt, hard cheeses, and dark, leafy greens. °¨ Whole grain breads. °· Do not eat seafood high in mercury, including: °¨ Swordfish. °¨ Tilefish. °¨ Shark. °¨ King mackerel. °¨ More than 6 oz tuna per week. °· Do not eat: °¨ Raw  or undercooked meats or eggs. °¨ Unpasteurized foods, such as soft cheeses (brie, blue, or feta), juices, and milks. °¨ Lunch meats. °¨ Hot dogs that have not been heated until they are steaming. °· Drink enough water to keep your urine clear or pale yellow. For many women, this may be 10 or more 8 oz glasses of water each day. Keeping yourself hydrated helps deliver nutrients to your baby and may prevent the start of pre-term uterine contractions. °· Do not use any tobacco products including cigarettes, chewing tobacco, or electronic cigarettes. If you need help quitting, ask your health care provider. °· Do not drink beverages containing alcohol. No safe level of alcohol consumption during pregnancy has been determined. °· Do not use any illegal drugs. These can harm your developing baby or cause a miscarriage. °· Ask your health care provider or pharmacist before taking any prescription or over-the-counter medicines, herbs, or supplements. °· Limit your caffeine intake to no more than 200 mg per day. °· Exercise. Unless told otherwise by your health care provider, try to get 30 minutes of moderate exercise most days of the week. Do not  do high-impact activities, contact sports, or activities with a high risk of falling, such as horseback riding or downhill skiing. °· Get plenty of rest. °· Avoid anything that raises your   body temperature, such as hot tubs and saunas. °· If you own a cat, do not empty its litter box. Bacteria contained in cat feces can cause an infection called toxoplasmosis. This can result in serious harm to the fetus. °· Stay away from chemicals such as insecticides, lead, mercury, and cleaning or paint products that contain solvents. °· Do not have any X-rays taken unless medically necessary. °· Take a childbirth and breastfeeding preparation class. Ask your health care provider if you need a referral or recommendation. °  °This information is not intended to replace advice given to you by  your health care provider. Make sure you discuss any questions you have with your health care provider. °  °Document Released: 09/12/2003 Document Revised: 09/30/2014 Document Reviewed: 11/24/2013 °[employer]vier Interactive Patient Education ©2016 [employer] ° °

## 2015-10-10 LAB — OB RESULTS CONSOLE ABO/RH: RH Type: POSITIVE

## 2015-10-10 LAB — OB RESULTS CONSOLE ANTIBODY SCREEN: ANTIBODY SCREEN: NEGATIVE

## 2015-10-10 LAB — OB RESULTS CONSOLE GC/CHLAMYDIA
CHLAMYDIA, DNA PROBE: NEGATIVE
GC PROBE AMP, GENITAL: NEGATIVE

## 2015-10-10 LAB — OB RESULTS CONSOLE HIV ANTIBODY (ROUTINE TESTING): HIV: NONREACTIVE

## 2015-10-10 LAB — OB RESULTS CONSOLE RPR: RPR: NONREACTIVE

## 2015-10-10 LAB — OB RESULTS CONSOLE HEPATITIS B SURFACE ANTIGEN: Hepatitis B Surface Ag: NEGATIVE

## 2015-10-10 LAB — OB RESULTS CONSOLE RUBELLA ANTIBODY, IGM: Rubella: IMMUNE

## 2016-04-04 ENCOUNTER — Ambulatory Visit (INDEPENDENT_AMBULATORY_CARE_PROVIDER_SITE_OTHER): Payer: Medicaid Other | Admitting: Obstetrics & Gynecology

## 2016-04-04 ENCOUNTER — Encounter: Payer: Self-pay | Admitting: Obstetrics & Gynecology

## 2016-04-04 ENCOUNTER — Encounter (HOSPITAL_COMMUNITY): Payer: Self-pay | Admitting: Obstetrics and Gynecology

## 2016-04-04 VITALS — BP 117/72 | HR 92 | Wt 195.0 lb

## 2016-04-04 DIAGNOSIS — O34219 Maternal care for unspecified type scar from previous cesarean delivery: Secondary | ICD-10-CM | POA: Diagnosis not present

## 2016-04-04 DIAGNOSIS — Z3483 Encounter for supervision of other normal pregnancy, third trimester: Secondary | ICD-10-CM

## 2016-04-04 DIAGNOSIS — Z3493 Encounter for supervision of normal pregnancy, unspecified, third trimester: Secondary | ICD-10-CM | POA: Insufficient documentation

## 2016-04-04 DIAGNOSIS — O480 Post-term pregnancy: Secondary | ICD-10-CM | POA: Diagnosis not present

## 2016-04-04 DIAGNOSIS — Z331 Pregnant state, incidental: Secondary | ICD-10-CM | POA: Diagnosis not present

## 2016-04-04 DIAGNOSIS — Z1389 Encounter for screening for other disorder: Secondary | ICD-10-CM | POA: Diagnosis not present

## 2016-04-04 NOTE — Patient Instructions (Signed)
Return to clinic for any scheduled appointments or obstetric concerns, or go to [facility]U for evaluation  

## 2016-04-04 NOTE — Progress Notes (Signed)
Subjective:  Rachel Aguirre is a 20 y.o. G2P1001 at 5361w6d being seen today for ongoing prenatal care.   She was a patient of Dr. Elsie StainMarshall's practice. She is currently monitored for the following issues for this low-risk pregnancy and has Previous cesarean delivery, antepartum and Supervision of normal pregnancy in third trimester on her problem list.  Patient reports no complaints.  Contractions: Irritability. Vag. Bleeding: None.  Movement: Present. Denies leaking of fluid.   The following portions of the patient's history were reviewed and updated as appropriate: allergies, current medications, past family history, past medical history, past social history, past surgical history and problem list. Problem list updated.  Objective:   Filed Vitals:   04/04/16 1449  BP: 117/72  Pulse: 92  Weight: 195 lb (88.451 kg)    Fetal Status: Fetal Heart Rate (bpm): 135   Movement: Present     General:  Alert, oriented and cooperative. Patient is in no acute distress.  Skin: Skin is warm and dry. No rash noted.   Cardiovascular: Normal heart rate noted  Respiratory: Normal respiratory effort, no problems with respiration noted  Abdomen: Soft, gravid, appropriate for gestational age. Pain/Pressure: Present     Pelvic:  Cervical exam deferred        Extremities: Normal range of motion.  Edema: None  Mental Status: Normal mood and affect. Normal behavior. Normal judgment and thought content.   Urinalysis: Urine Protein: Negative Urine Glucose: Negative  Assessment and Plan:  Pregnancy: G2P1001 at 2961w6d 1. Previous cesarean delivery, antepartum TOLAC vs RCS counseling done, risks/benefits reviewed in detail. She desires TOLAC, consent signed.  2. Post term pregnancy over 40 weeks Will start postdates testing next week. IOL scheduled at 41 weeks (04/12/16 at 0700) - US OB Limited; Future/AFI next week  3. Supervision of normal pregnancy in third trimester Term labor symptoms and general obstetric  precautions including but not limited to vaginal bleeding, contractions, leaking of fluid and fetal movement were reviewed in detail with the patient. Please refer to After Visit Summary for other counseling recommendations.  Return for 04/08/16 NST only (RN visit)     7/20 NST, OB visit, AFI ([facility]   Tereso NewcomerUgonna A Jahniyah Revere, MD

## 2016-04-05 ENCOUNTER — Telehealth (HOSPITAL_COMMUNITY): Payer: Self-pay | Admitting: *Deleted

## 2016-04-05 ENCOUNTER — Encounter (HOSPITAL_COMMUNITY): Payer: Self-pay | Admitting: *Deleted

## 2016-04-05 LAB — OB RESULTS CONSOLE GBS: GBS: POSITIVE

## 2016-04-05 NOTE — Telephone Encounter (Signed)
Preadmission screen  

## 2016-04-07 ENCOUNTER — Encounter (HOSPITAL_COMMUNITY): Payer: Self-pay | Admitting: *Deleted

## 2016-04-07 ENCOUNTER — Inpatient Hospital Stay (HOSPITAL_COMMUNITY)
Admission: AD | Admit: 2016-04-07 | Discharge: 2016-04-07 | Disposition: A | Discharge status: Home / Self Care | Payer: Medicaid Other | Source: Ambulatory Visit | Attending: Obstetrics & Gynecology | Admitting: Obstetrics & Gynecology

## 2016-04-07 DIAGNOSIS — Z3493 Encounter for supervision of normal pregnancy, unspecified, third trimester: Secondary | ICD-10-CM

## 2016-04-07 DIAGNOSIS — O34219 Maternal care for unspecified type scar from previous cesarean delivery: Secondary | ICD-10-CM

## 2016-04-07 DIAGNOSIS — O471 False labor at or after 37 completed weeks of gestation: Secondary | ICD-10-CM

## 2016-04-07 MED ORDER — NALBUPHINE HCL 10 MG/ML IJ SOLN
10.0000 mg | INTRAMUSCULAR | Status: DC | PRN
  Administered 2016-04-07: 10 mg via INTRAVENOUS

## 2016-04-07 MED ORDER — PROMETHAZINE HCL 25 MG/ML IJ SOLN
12.5000 mg | Freq: Once | INTRAMUSCULAR | Status: AC
  Administered 2016-04-07: 12.5 mg via INTRAVENOUS
  Filled 2016-04-07: qty 1

## 2016-04-07 MED ORDER — NALBUPHINE HCL 10 MG/ML IJ SOLN
10.0000 mg | INTRAMUSCULAR | Status: DC | PRN
  Filled 2016-04-07 (×2): qty 1

## 2016-04-07 NOTE — Discharge Instructions (Signed)
Braxton Hicks Contractions °Contractions of the uterus can occur throughout pregnancy. Contractions are not always a sign that you are in labor.  °WHAT ARE BRAXTON HICKS CONTRACTIONS?  °Contractions that occur before labor are called Braxton Hicks contractions, or false labor. Toward the end of pregnancy (32-34 weeks), these contractions can develop more often and may become more forceful. This is not true labor because these contractions do not result in opening (dilatation) and thinning of the cervix. They are sometimes difficult to tell apart from true labor because these contractions can be forceful and people have different pain tolerances. You should not feel embarrassed if you go to the hospital with false labor. Sometimes, the only way to tell if you are in true labor is for your health care provider to look for changes in the cervix. °If there are no prenatal problems or other health problems associated with the pregnancy, it is completely safe to be sent home with false labor and await the onset of true labor. °HOW CAN YOU TELL THE DIFFERENCE BETWEEN TRUE AND FALSE LABOR? °False Labor °· The contractions of false labor are usually shorter and not as hard as those of true labor.   °· The contractions are usually irregular.   °· The contractions are often felt in the front of the lower abdomen and in the groin.   °· The contractions may go away when you walk around or change positions while lying down.   °· The contractions get weaker and are shorter lasting as time goes on.   °· The contractions do not usually become progressively stronger, regular, and closer together as with true labor.   °True Labor °· Contractions in true labor last 30-70 seconds, become very regular, usually become more intense, and increase in frequency.   °· The contractions do not go away with walking.   °· The discomfort is usually felt in the top of the uterus and spreads to the lower abdomen and low back.   °· True labor can be  determined by your health care provider with an exam. This will show that the cervix is dilating and getting thinner.   °WHAT TO REMEMBER °· Keep up with your usual exercises and follow other instructions given by your health care provider.   °· Take medicines as directed by your health care provider.   °· Keep your regular prenatal appointments.   °· Eat and drink lightly if you think you are going into labor.   °· If Braxton Hicks contractions are making you uncomfortable:   °¨ Change your position from lying down or resting to walking, or from walking to resting.   °¨ Sit and rest in a tub of warm water.   °¨ Drink 2-3 glasses of water. Dehydration may cause these contractions.   °¨ Do slow and deep breathing several times an hour.   °WHEN SHOULD I SEEK IMMEDIATE MEDICAL CARE? °Seek immediate medical care if: °· Your contractions become stronger, more regular, and closer together.   °· You have fluid leaking or gushing from your vagina.   °· You have a fever.   °· You pass blood-tinged mucus.   °· You have vaginal bleeding.   °· You have continuous abdominal pain.   °· You have low back pain that you never had before.   °· You feel your baby's head pushing down and causing pelvic pressure.   °· Your baby is not moving as much as it used to.   °  °This information is not intended to replace advice given to you by your health care provider. Make sure you discuss any questions you have with your health care   provider. °  °Document Released: 09/09/2005 Document Revised: 09/14/2013 Document Reviewed: 06/21/2013 °Elsevier Interactive Patient Education ©2016 [employer] ° °

## 2016-04-07 NOTE — MAU Note (Signed)
Pt started feeling contractions around 0500.  They have consistently gotten worse.  Denied lof/vb.

## 2016-04-07 NOTE — H&P (Signed)
Rachel Aguirre is a 20 y.o. female G2P1001 @ 40.2 wks presenting for reg contractions since 0500. Denies ROM or vaginal bleeding. History OB History    Gravida Para Term Preterm AB TAB SAB Ectopic Multiple Living   2 1 1       0 1     History reviewed. No pertinent past medical history. Past Surgical History  Procedure Laterality Date  . Cesarean section N/A 03/27/2015    Procedure: CESAREAN SECTION;  Surgeon: Brock Badharles A Harper, MD;  Location: [facility];  Service: Obstetrics;  Laterality: N/A;   Family History: family history includes Hypertension in her mother. Social History:  reports that she has never smoked. She has never used smokeless tobacco. She reports that she does not drink alcohol or use illicit drugs.   Prenatal Transfer Tool  Maternal Diabetes: No Genetic Screening: Normal Maternal Ultrasounds/Referrals: Normal Fetal Ultrasounds or other Referrals:  None Maternal Substance Abuse:  No Significant Maternal Medications:  None Significant Maternal Lab Results:  None Other Comments:  None  Review of Systems  Constitutional: Negative.   HENT: Negative.   Eyes: Negative.   Respiratory: Negative.   Cardiovascular: Negative.   Gastrointestinal: Positive for abdominal pain.  Genitourinary: Negative.   Musculoskeletal: Negative.   Skin: Negative.   Neurological: Negative.   Endo/Heme/Allergies: Negative.   Psychiatric/Behavioral: Negative.     Dilation: 2 Effacement (%): 80 Station: -2 Exam by:: Marlynn Perking. Aguirre CNM unknown if currently breastfeeding. Maternal Exam:  Uterine Assessment: Contraction strength is moderate.  Contraction frequency is regular.   Abdomen: Surgical scars: low transverse.   Fetal presentation: vertex  Introitus: Normal vulva. Normal vagina.  Amniotic fluid character: not assessed.  Pelvis: adequate for delivery.   Cervix: Cervix evaluated by digital exam.     Fetal Exam Fetal Monitor Review: Mode: ultrasound.   Variability: moderate (6-25  bpm).   Pattern: accelerations present.    Fetal State Assessment: Category I - tracings are normal.     Physical Exam  Constitutional: She is oriented to person, place, and time. She appears well-developed and well-nourished.  HENT:  Head: Normocephalic.  Eyes: Pupils are equal, round, and reactive to light.  Neck: Normal range of motion.  Cardiovascular: Normal rate, regular rhythm, normal heart sounds and intact distal pulses.   Respiratory: Effort normal and breath sounds normal.  GI: Soft. Bowel sounds are normal.  Genitourinary: Vagina normal and uterus normal.  Musculoskeletal: Normal range of motion.  Neurological: She is alert and oriented to person, place, and time. She has normal reflexes.  Skin: Skin is warm and dry.  Psychiatric: She has a normal mood and affect. Her behavior is normal. Judgment and thought content normal.    Prenatal labs: ABO, Rh: O/Positive/-- (01/17 0000) Antibody: Negative (01/17 0000) Rubella: Immune (01/17 0000) RPR: Nonreactive (01/17 0000)  HBsAg: Negative (01/17 0000)  HIV: Non-reactive (01/17 0000)  GBS: Positive (07/14 0000)   Assessment/Plan: Early labor, stable maternal fetal unit. SVE 2/80/-2 no cervical change in two hours   Rachel Aguirre 04/07/2016, 6:18 PM

## 2016-04-08 ENCOUNTER — Encounter (HOSPITAL_COMMUNITY): Payer: Self-pay | Admitting: *Deleted

## 2016-04-08 ENCOUNTER — Inpatient Hospital Stay (HOSPITAL_COMMUNITY): Payer: Medicaid Other | Admitting: Anesthesiology

## 2016-04-08 ENCOUNTER — Encounter (HOSPITAL_COMMUNITY)
Admission: AD | Disposition: A | Discharge status: Home / Self Care | Payer: Self-pay | Source: Ambulatory Visit | Attending: Obstetrics and Gynecology

## 2016-04-08 ENCOUNTER — Inpatient Hospital Stay (HOSPITAL_COMMUNITY)
Admission: AD | Admit: 2016-04-08 | Discharge: 2016-04-11 | DRG: 766 | Disposition: A | Discharge status: Home / Self Care | Payer: Medicaid Other | Source: Ambulatory Visit | Attending: Obstetrics and Gynecology | Admitting: Obstetrics and Gynecology

## 2016-04-08 DIAGNOSIS — Z8249 Family history of ischemic heart disease and other diseases of the circulatory system: Secondary | ICD-10-CM | POA: Diagnosis not present

## 2016-04-08 DIAGNOSIS — O34211 Maternal care for low transverse scar from previous cesarean delivery: Secondary | ICD-10-CM | POA: Diagnosis present

## 2016-04-08 DIAGNOSIS — O339 Maternal care for disproportion, unspecified: Secondary | ICD-10-CM | POA: Diagnosis present

## 2016-04-08 DIAGNOSIS — Z3493 Encounter for supervision of normal pregnancy, unspecified, third trimester: Secondary | ICD-10-CM

## 2016-04-08 DIAGNOSIS — O34219 Maternal care for unspecified type scar from previous cesarean delivery: Secondary | ICD-10-CM

## 2016-04-08 DIAGNOSIS — O99824 Streptococcus B carrier state complicating childbirth: Secondary | ICD-10-CM | POA: Diagnosis present

## 2016-04-08 DIAGNOSIS — O9081 Anemia of the puerperium: Secondary | ICD-10-CM | POA: Diagnosis present

## 2016-04-08 DIAGNOSIS — D649 Anemia, unspecified: Secondary | ICD-10-CM | POA: Diagnosis present

## 2016-04-08 DIAGNOSIS — Z98891 History of uterine scar from previous surgery: Secondary | ICD-10-CM

## 2016-04-08 DIAGNOSIS — Z3A40 40 weeks gestation of pregnancy: Secondary | ICD-10-CM

## 2016-04-08 DIAGNOSIS — Z3483 Encounter for supervision of other normal pregnancy, third trimester: Secondary | ICD-10-CM | POA: Diagnosis present

## 2016-04-08 HISTORY — DX: Unspecified infectious disease: B99.9

## 2016-04-08 LAB — CBC
HEMATOCRIT: 37.7 % (ref 36.0–46.0)
HEMOGLOBIN: 12.7 g/dL (ref 12.0–15.0)
MCH: 25.5 pg — ABNORMAL LOW (ref 26.0–34.0)
MCHC: 33.7 g/dL (ref 30.0–36.0)
MCV: 75.6 fL — ABNORMAL LOW (ref 78.0–100.0)
Platelets: 165 10*3/uL (ref 150–400)
RBC: 4.99 MIL/uL (ref 3.87–5.11)
RDW: 17 % — ABNORMAL HIGH (ref 11.5–15.5)
WBC: 6.6 10*3/uL (ref 4.0–10.5)

## 2016-04-08 LAB — TYPE AND SCREEN
ABO/RH(D): O POS
ANTIBODY SCREEN: NEGATIVE

## 2016-04-08 SURGERY — Surgical Case
Anesthesia: Epidural

## 2016-04-08 MED ORDER — OXYCODONE-ACETAMINOPHEN 5-325 MG PO TABS: 2.0000 | ORAL_TABLET | ORAL | Status: DC | PRN

## 2016-04-08 MED ORDER — FENTANYL CITRATE (PF) 100 MCG/2ML IJ SOLN: 50.0000 ug | INTRAMUSCULAR | Status: DC | PRN

## 2016-04-08 MED ORDER — LACTATED RINGERS IV SOLN
INTRAVENOUS | Status: DC
  Administered 2016-04-08 (×3): via INTRAVENOUS

## 2016-04-08 MED ORDER — PHENYLEPHRINE 40 MCG/ML (10ML) SYRINGE FOR IV PUSH (FOR BLOOD PRESSURE SUPPORT)
80.0000 ug | PREFILLED_SYRINGE | INTRAVENOUS | Status: DC | PRN
  Filled 2016-04-08 (×2): qty 10
  Filled 2016-04-08: qty 5

## 2016-04-08 MED ORDER — ONDANSETRON HCL 4 MG/2ML IJ SOLN
INTRAMUSCULAR | Status: AC
  Filled 2016-04-08: qty 2

## 2016-04-08 MED ORDER — SCOPOLAMINE 1 MG/3DAYS TD PT72
MEDICATED_PATCH | TRANSDERMAL | Status: DC | PRN
  Administered 2016-04-08: 1 via TRANSDERMAL

## 2016-04-08 MED ORDER — TERBUTALINE SULFATE 1 MG/ML IJ SOLN
0.2500 mg | Freq: Once | INTRAMUSCULAR | Status: AC | PRN
  Administered 2016-04-08: 0.25 mg via SUBCUTANEOUS
  Filled 2016-04-08: qty 1

## 2016-04-08 MED ORDER — ONDANSETRON HCL 4 MG/2ML IJ SOLN
INTRAMUSCULAR | Status: DC | PRN
  Administered 2016-04-08: 4 mg via INTRAVENOUS

## 2016-04-08 MED ORDER — FENTANYL CITRATE (PF) 100 MCG/2ML IJ SOLN
25.0000 ug | INTRAMUSCULAR | Status: DC | PRN
  Administered 2016-04-09: 25 ug via INTRAVENOUS

## 2016-04-08 MED ORDER — NALBUPHINE HCL 10 MG/ML IJ SOLN: 5.0000 mg | Freq: Once | INTRAMUSCULAR | Status: DC | PRN

## 2016-04-08 MED ORDER — NALBUPHINE HCL 10 MG/ML IJ SOLN: 5.0000 mg | INTRAMUSCULAR | Status: DC | PRN

## 2016-04-08 MED ORDER — LIDOCAINE HCL (PF) 1 % IJ SOLN
INTRAMUSCULAR | Status: DC | PRN
  Administered 2016-04-08: 6 mL via EPIDURAL
  Administered 2016-04-08: 4 mL

## 2016-04-08 MED ORDER — SODIUM CHLORIDE 0.9% FLUSH: 3.0000 mL | INTRAVENOUS | Status: DC | PRN

## 2016-04-08 MED ORDER — OXYTOCIN BOLUS FROM INFUSION: 500.0000 mL | INTRAVENOUS | Status: DC

## 2016-04-08 MED ORDER — ONDANSETRON HCL 4 MG/2ML IJ SOLN: 4.0000 mg | Freq: Four times a day (QID) | INTRAMUSCULAR | Status: DC | PRN

## 2016-04-08 MED ORDER — LACTATED RINGERS IV SOLN
500.0000 mL | INTRAVENOUS | Status: DC | PRN
  Administered 2016-04-08 (×2): 1000 mL via INTRAVENOUS

## 2016-04-08 MED ORDER — OXYTOCIN 10 UNIT/ML IJ SOLN
INTRAMUSCULAR | Status: AC
  Filled 2016-04-08: qty 4

## 2016-04-08 MED ORDER — LACTATED RINGERS IV SOLN
500.0000 mL | Freq: Once | INTRAVENOUS | Status: AC
  Administered 2016-04-08: 500 mL via INTRAVENOUS

## 2016-04-08 MED ORDER — ACETAMINOPHEN 325 MG PO TABS: 650.0000 mg | ORAL_TABLET | ORAL | Status: DC | PRN

## 2016-04-08 MED ORDER — LIDOCAINE HCL (PF) 1 % IJ SOLN: 30.0000 mL | INTRAMUSCULAR | Status: DC | PRN

## 2016-04-08 MED ORDER — SODIUM BICARBONATE 8.4 % IV SOLN
INTRAVENOUS | Status: AC
  Filled 2016-04-08: qty 50

## 2016-04-08 MED ORDER — AZITHROMYCIN 500 MG IV SOLR
500.0000 mg | Freq: Once | INTRAVENOUS | Status: AC
  Administered 2016-04-08: 500 mg via INTRAVENOUS
  Filled 2016-04-08: qty 500

## 2016-04-08 MED ORDER — DIPHENHYDRAMINE HCL 25 MG PO CAPS
25.0000 mg | ORAL_CAPSULE | ORAL | Status: DC | PRN
  Filled 2016-04-08: qty 1

## 2016-04-08 MED ORDER — KETOROLAC TROMETHAMINE 30 MG/ML IJ SOLN
30.0000 mg | Freq: Four times a day (QID) | INTRAMUSCULAR | Status: AC | PRN
  Filled 2016-04-08: qty 1

## 2016-04-08 MED ORDER — ONDANSETRON HCL 4 MG/2ML IJ SOLN
4.0000 mg | Freq: Three times a day (TID) | INTRAMUSCULAR | Status: DC | PRN
Start: 2016-04-08 — End: 2016-04-11
  Administered 2016-04-09: 4 mg via INTRAVENOUS
  Filled 2016-04-08: qty 2

## 2016-04-08 MED ORDER — MORPHINE SULFATE (PF) 0.5 MG/ML IJ SOLN
INTRAMUSCULAR | Status: DC | PRN
  Administered 2016-04-08: 1 mg via INTRAVENOUS
  Administered 2016-04-08: 4 mg via EPIDURAL

## 2016-04-08 MED ORDER — MEPERIDINE HCL 25 MG/ML IJ SOLN: 6.2500 mg | INTRAMUSCULAR | Status: DC | PRN

## 2016-04-08 MED ORDER — LIDOCAINE-EPINEPHRINE (PF) 2 %-1:200000 IJ SOLN
INTRAMUSCULAR | Status: AC
  Filled 2016-04-08: qty 20

## 2016-04-08 MED ORDER — SODIUM CHLORIDE 0.9 % IR SOLN
Status: DC | PRN
  Administered 2016-04-08: 1000 mL

## 2016-04-08 MED ORDER — EPHEDRINE 5 MG/ML INJ
10.0000 mg | INTRAVENOUS | Status: DC | PRN
  Filled 2016-04-08: qty 2

## 2016-04-08 MED ORDER — LACTATED RINGERS IV SOLN
INTRAVENOUS | Status: DC | PRN
  Administered 2016-04-08 (×3): via INTRAVENOUS

## 2016-04-08 MED ORDER — HYDROXYZINE HCL 50 MG PO TABS: 50.0000 mg | ORAL_TABLET | Freq: Four times a day (QID) | ORAL | Status: DC | PRN

## 2016-04-08 MED ORDER — CEFAZOLIN SODIUM-DEXTROSE 2-4 GM/100ML-% IV SOLN
2.0000 g | INTRAVENOUS | Status: AC
  Administered 2016-04-08: 2 g via INTRAVENOUS

## 2016-04-08 MED ORDER — DIPHENHYDRAMINE HCL 50 MG/ML IJ SOLN: 12.5000 mg | INTRAMUSCULAR | Status: DC | PRN

## 2016-04-08 MED ORDER — PHENYLEPHRINE 40 MCG/ML (10ML) SYRINGE FOR IV PUSH (FOR BLOOD PRESSURE SUPPORT)
80.0000 ug | PREFILLED_SYRINGE | INTRAVENOUS | Status: DC | PRN
  Administered 2016-04-08 (×2): 80 ug via INTRAVENOUS
  Filled 2016-04-08: qty 5

## 2016-04-08 MED ORDER — SODIUM BICARBONATE 8.4 % IV SOLN
INTRAVENOUS | Status: DC | PRN
  Administered 2016-04-08 (×6): 5 mL via EPIDURAL

## 2016-04-08 MED ORDER — SCOPOLAMINE 1 MG/3DAYS TD PT72: 1.0000 | MEDICATED_PATCH | Freq: Once | TRANSDERMAL | Status: DC

## 2016-04-08 MED ORDER — OXYTOCIN 10 UNIT/ML IJ SOLN
40.0000 [IU] | INTRAVENOUS | Status: DC | PRN
  Administered 2016-04-08: 40 [IU] via INTRAVENOUS

## 2016-04-08 MED ORDER — MORPHINE SULFATE (PF) 0.5 MG/ML IJ SOLN
INTRAMUSCULAR | Status: AC
  Filled 2016-04-08: qty 10

## 2016-04-08 MED ORDER — LACTATED RINGERS IV SOLN: INTRAVENOUS | Status: DC

## 2016-04-08 MED ORDER — SODIUM CHLORIDE 0.9% FLUSH: 3.0000 mL | Freq: Two times a day (BID) | INTRAVENOUS | Status: DC

## 2016-04-08 MED ORDER — MEPERIDINE HCL 25 MG/ML IJ SOLN
INTRAMUSCULAR | Status: DC | PRN
  Administered 2016-04-08 (×2): 12.5 mg via INTRAVENOUS

## 2016-04-08 MED ORDER — PENICILLIN G POTASSIUM 5000000 UNITS IJ SOLR
5.0000 10*6.[IU] | Freq: Once | INTRAVENOUS | Status: AC
  Administered 2016-04-08: 5 10*6.[IU] via INTRAVENOUS
  Filled 2016-04-08: qty 5

## 2016-04-08 MED ORDER — SODIUM CHLORIDE 0.9 % IV SOLN: 250.0000 mL | INTRAVENOUS | Status: DC | PRN

## 2016-04-08 MED ORDER — TERBUTALINE SULFATE 1 MG/ML IJ SOLN
0.2500 mg | Freq: Once | INTRAMUSCULAR | Status: AC
  Administered 2016-04-08: 0.25 mg via SUBCUTANEOUS

## 2016-04-08 MED ORDER — OXYCODONE-ACETAMINOPHEN 5-325 MG PO TABS: 1.0000 | ORAL_TABLET | ORAL | Status: DC | PRN

## 2016-04-08 MED ORDER — NALOXONE HCL 2 MG/2ML IJ SOSY
1.0000 ug/kg/h | PREFILLED_SYRINGE | INTRAVENOUS | Status: DC | PRN
  Filled 2016-04-08: qty 2

## 2016-04-08 MED ORDER — KETOROLAC TROMETHAMINE 30 MG/ML IJ SOLN: 30.0000 mg | Freq: Four times a day (QID) | INTRAMUSCULAR | Status: AC | PRN

## 2016-04-08 MED ORDER — SOD CITRATE-CITRIC ACID 500-334 MG/5ML PO SOLN
30.0000 mL | ORAL | Status: DC | PRN
  Administered 2016-04-08: 30 mL via ORAL
  Filled 2016-04-08: qty 15

## 2016-04-08 MED ORDER — OXYTOCIN 40 UNITS IN LACTATED RINGERS INFUSION - SIMPLE MED
1.0000 m[IU]/min | INTRAVENOUS | Status: DC
  Administered 2016-04-08: 1 m[IU]/min via INTRAVENOUS

## 2016-04-08 MED ORDER — MEPERIDINE HCL 25 MG/ML IJ SOLN
INTRAMUSCULAR | Status: AC
  Filled 2016-04-08: qty 1

## 2016-04-08 MED ORDER — PROMETHAZINE HCL 25 MG/ML IJ SOLN
6.2500 mg | INTRAMUSCULAR | Status: DC | PRN
Start: 2016-04-08 — End: 2016-04-09

## 2016-04-08 MED ORDER — CEFAZOLIN SODIUM-DEXTROSE 2-4 GM/100ML-% IV SOLN
INTRAVENOUS | Status: AC
  Filled 2016-04-08: qty 100

## 2016-04-08 MED ORDER — FENTANYL 2.5 MCG/ML BUPIVACAINE 1/10 % EPIDURAL INFUSION (WH - ANES)
14.0000 mL/h | INTRAMUSCULAR | Status: DC | PRN
  Administered 2016-04-08 (×2): 14 mL/h via EPIDURAL
  Filled 2016-04-08 (×2): qty 125

## 2016-04-08 MED ORDER — OXYTOCIN 40 UNITS IN LACTATED RINGERS INFUSION - SIMPLE MED
2.5000 [IU]/h | INTRAVENOUS | Status: DC
  Filled 2016-04-08: qty 1000

## 2016-04-08 MED ORDER — PENICILLIN G POTASSIUM 5000000 UNITS IJ SOLR
2.5000 10*6.[IU] | INTRAVENOUS | Status: DC
  Administered 2016-04-08 (×3): 2.5 10*6.[IU] via INTRAVENOUS
  Filled 2016-04-08 (×4): qty 2.5

## 2016-04-08 MED ORDER — OXYTOCIN 10 UNIT/ML IJ SOLN: 10.0000 [IU] | Freq: Once | INTRAMUSCULAR | Status: DC

## 2016-04-08 MED ORDER — NALOXONE HCL 0.4 MG/ML IJ SOLN: 0.4000 mg | INTRAMUSCULAR | Status: DC | PRN

## 2016-04-08 SURGICAL SUPPLY — 34 items
BENZOIN TINCTURE PRP APPL 2/3 (GAUZE/BANDAGES/DRESSINGS) ×3 IMPLANT
CANISTER SUCT 3000ML PPV (MISCELLANEOUS) ×3 IMPLANT
CHLORAPREP W/TINT 26ML (MISCELLANEOUS) ×3 IMPLANT
CLOSURE STERI STRIP 1/2 X4 (GAUZE/BANDAGES/DRESSINGS) ×3 IMPLANT
DRSG OPSITE POSTOP 4X10 (GAUZE/BANDAGES/DRESSINGS) ×3 IMPLANT
DRSG TELFA 3X8 NADH (GAUZE/BANDAGES/DRESSINGS) IMPLANT
ELECT CAUTERY BLADE 6.4 (BLADE) ×3 IMPLANT
ELECT REM PT RETURN 9FT ADLT (ELECTROSURGICAL) ×3
ELECTRODE REM PT RTRN 9FT ADLT (ELECTROSURGICAL) ×1 IMPLANT
GAUZE SPONGE 4X4 12PLY STRL LF (GAUZE/BANDAGES/DRESSINGS) ×6 IMPLANT
GLOVE BIOGEL PI IND STRL 7.0 (GLOVE) ×2 IMPLANT
GLOVE BIOGEL PI IND STRL 7.5 (GLOVE) ×1 IMPLANT
GLOVE BIOGEL PI INDICATOR 7.0 (GLOVE) ×4
GLOVE BIOGEL PI INDICATOR 7.5 (GLOVE) ×2
GLOVE SURG SS PI 7.0 STRL IVOR (GLOVE) ×3 IMPLANT
GOWN STRL REUS W/ TWL LRG LVL3 (GOWN DISPOSABLE) ×2 IMPLANT
GOWN STRL REUS W/ TWL XL LVL3 (GOWN DISPOSABLE) ×1 IMPLANT
GOWN STRL REUS W/TWL LRG LVL3 (GOWN DISPOSABLE) ×4
GOWN STRL REUS W/TWL XL LVL3 (GOWN DISPOSABLE) ×2
LIQUID BAND (GAUZE/BANDAGES/DRESSINGS) ×3 IMPLANT
NS IRRIG 1000ML POUR BTL (IV SOLUTION) ×3 IMPLANT
PACK C SECTION WH (CUSTOM PROCEDURE TRAY) ×3 IMPLANT
PAD ABD 7.5X8 STRL (GAUZE/BANDAGES/DRESSINGS) ×3 IMPLANT
PAD OB MATERNITY 4.3X12.25 (PERSONAL CARE ITEMS) ×3 IMPLANT
PAD PREP 24X48 CUFFED NSTRL (MISCELLANEOUS) ×3 IMPLANT
SPONGE LAP 18X18 X RAY DECT (DISPOSABLE) ×12 IMPLANT
STRIP CLOSURE SKIN 1/2X4 (GAUZE/BANDAGES/DRESSINGS) ×2 IMPLANT
SUT MON AB 4-0 PS1 27 (SUTURE) ×3 IMPLANT
SUT MON AB-0 CT1 36 (SUTURE) ×12 IMPLANT
SUT PLAIN 2 0 (SUTURE) ×4
SUT PLAIN ABS 2-0 CT1 27XMFL (SUTURE) ×2 IMPLANT
SUT VIC AB 0 CT1 36 (SUTURE) ×6 IMPLANT
SUT VIC AB 3-0 CT1 27 (SUTURE) ×4
SUT VIC AB 3-0 CT1 TAPERPNT 27 (SUTURE) ×2 IMPLANT

## 2016-04-08 NOTE — MAU Note (Signed)
Slept off and on last night. Contractions have continued, getting stronger. No bleeding or leaking

## 2016-04-08 NOTE — Anesthesia Pain Management Evaluation Note (Signed)
  CRNA Pain Management Visit Note  Patient: Rachel Aguirre, 20 y.o., female  "Hello I am a member of the anesthesia team at [facility]. We have an anesthesia team available at all times to provide care throughout the hospital, including epidural management and anesthesia for C-section. I don't know your plan for the delivery whether it a natural birth, water birth, IV sedation, nitrous supplementation, doula or epidural, but we want to meet your pain goals."   1.Was your pain managed to your expectations on prior hospitalizations?   Yes   2.What is your expectation for pain management during this hospitalization?     Epidural  3.How can we help you reach that goal? Epidural in situ.  Record the patient's initial score and the patient's pain goal.   Pain: 0  Pain Goal: 9 [facility] wants you to be able to say your pain was always managed very well.  Rachel Aguirre L 04/08/2016

## 2016-04-08 NOTE — Progress Notes (Signed)
L&D Note Went to see patient due to decelerations. Contracting q2-9780m with recurrent lates and variables to the 90s with decent recovery. Pitocin turned off, IVF bolus and O2 running. SVE 8-9/80/+1 for presenting part. Terbutaline ordered with hopefully EFM due to either rapid descent/dilation and/or fetal tachysystole.  Will let patient labor down and hopefully last of cervix is gone and if so, can try pushing and would recommend operative VD if is a candidate.   Rachel Aguirre, Jr MD Attending Center for [facility](Faculty Practice)

## 2016-04-08 NOTE — Anesthesia Postprocedure Evaluation (Signed)
Anesthesia Post Note  Patient: Rachel Aguirre  Procedure(s) Performed: Procedure(s) (LRB): CESAREAN SECTION (N/A)  Patient location during evaluation: PACU Anesthesia Type: Epidural Level of consciousness: oriented and awake and alert Pain management: pain level controlled Vital Signs Assessment: post-procedure vital signs reviewed and stable Respiratory status: spontaneous breathing, respiratory function stable and patient connected to nasal cannula oxygen Cardiovascular status: blood pressure returned to baseline and stable Postop Assessment: no headache, no backache and epidural receding Anesthetic complications: no     Last Vitals:  Filed Vitals:   04/08/16 2030 04/08/16 2100  BP: 127/85 142/79  Pulse: 93 102  Temp: 37.1 C   Resp: 18 18    Last Pain:  Filed Vitals:   04/08/16 2252  PainSc: 7    Pain Goal:                 Shelton SilvasKevin D Jennene Downie

## 2016-04-08 NOTE — Op Note (Signed)
Operative Note   SURGERY DATE: 04/08/2016  PRE-OP DIAGNOSIS:  *Intrauterine pregnancy @ 40/3 weeks *Arrest of dilation (8-9cm), failed VBAC *Recurrent late and variable decelerations *History of prior cesarean and desire for repeat *GBS positive *Close interval pregnancy  POST-OP DIAGNOSIS: Same. Cephalopelvic disproportion. Delivered. Moderate pelvic adhesive diseae   PROCEDURE: repeat low transverse cesarean section via pfannenstiel skin incision with double layer uterine closure  SURGEON: Surgeon(s) and Role:    * Seth Ward Bing, MD - Primary  ASSISTANT: Dr. Jen Mow  ANESTHESIA: epidural  ESTIMATED BLOOD LOSS:  DRAINS: UOP via indwelling foley  TOTAL IV FLUIDS: crystalloid  VTE Prophylaxis: SCDs to bilateral lower extremities  ANTIBIOTICS: Ancef 2gm and Azithromycin  IV, within 1 hour of skin incision  SPECIMENS: placenta  COMPLICATIONS: None  FINDINGS: Moderate intra-abdominal adhesions were noted from the skin to the fascia (dense, connective tissue; filmy adhesions at the layer of the peritoneum, uterus and omentum). Grossly normal uterus, tubes and ovaries. Clear amniotic fluid, cephalic female infant, weight 3535gm, APGARs 3/8, intact placenta. Very  narrow pelvic arch.   Lower uterine segment was repaired in multiple layers, due to it being thin and filmy  Cord gases Results for TEMPERENCE, ZENOR (MRN [id number]) as of 04/09/2016 10:09  Ref. Range 04/08/2016 22:18 04/08/2016 22:21  pH cord blood Unknown 7.24 7.136  pCO2 cord blood Latest Units: mmHg 56.6 70.2  Bicarbonate Latest Ref Range: 20.0-24.0 mEq/L 23.4 22.7  TCO2 Latest Ref Range: 0-100 mmol/L 25.1 24.8  Acid-base deficit Latest Ref Range: 0.0-2.0 mmol/L 4.4 (H) 7.8 (H)    PROCEDURE IN DETAIL: The patient was taken to the operating room where anesthesia was administered and normal fetal heart tones were confirmed. She was then prepped and draped in the normal fashion in  the dorsal supine position with a leftward tilt.  After a time out was performed, a pfannensteil  skin incision was made with the scalpel and carried through to the underlying layer of fascia. The fascia was then incised at the midline and this incision was extended laterally with the mayo scissors. Attention was turned to the superior aspect of the fascial incision which was grasped with the kocher clamps x 2, tented up and the rectus muscles were dissected off with the scalpel. In a similar fashion the inferior aspect of the fascial incision was grasped with the kocher clamps, tented up and the rectus muscles dissected off with the mayo scissors. The rectus muscles were then separated in the midline and the peritoneum was entered bluntly. The bladder blade was inserted and the vesicouterine peritoneum was identified, tented up and entered with the metzenbaum scissors. This incision was extended laterally and the bladder flap was created digitally. The bladder blade was reinserted.  A low transverse hysterotomy was made with the scalpel until the endometrial cavity was breached yielding clear amniotic fluid. This incision was extended bluntly and the infant's head, shoulders and body were delivered with difficulty due to the fetus being low in the narrow pelvis and scar tissue that didn't allow great opening of the abdominal cavity. The fetus was delivered.The cord was clamped x 2 and cut, and the infant was handed to the awaiting pediatricians immediately.  The placenta was then gradually expressed from the uterus and then the uterus was exteriorized and cleared of all clots and debris. The hysterotomy was repaired with multiple layers and suture of 1-0 monocryl in imbricating fashion, along with figure of eight stitches for excellent hemostasis.   The  uterus and adnexa were then returned to the abdomen, and the hysterotomy and all operative sites were reinspected and excellent hemostasis was noted after  irrigation and suction of the abdomen with warm saline.  The fascia was reapproximated with 0 vicryl in a simple running fashion. The subcutaneous layer was then reapproximated with interrupted sutures of 2-0 plain gut, and the skin was then closed with 4-0 monocryl, in a subcuticular fashion.  The patient  tolerated the procedure well. Sponge, lap, needle, and instrument counts were correct x 2. The patient was transferred to the recovery room awake, alert and breathing independently in stable condition.   GIVEN THE REPAIR OF THE UTERUS AND LOWER UTERINE SEGMENT, I DISCUSSED WITH THE PATIENT AND MOTHER THAT I WOULD RECOMMEND LONG INTERVAL FOR PREGNANCY AND ANY DELIVERIES AT 37 WEEKS AKA TO TREAT INCISION AS A CLASSICAL. THIS WILL BE REITERATED ON ROUNDS.   Cornelia Copaharlie Jshawn Hurta, Jr. MD Attending Center for [facility]Faculty Practice)

## 2016-04-08 NOTE — Transfer of Care (Signed)
Immediate Anesthesia Transfer of Care Note  Patient: Rachel Aguirre  Procedure(s) Performed: Procedure(s): CESAREAN SECTION (N/A)  Patient Location: PACU  Anesthesia Type:Epidural  Level of Consciousness: awake  Airway & Oxygen Therapy: Patient Spontanous Breathing  Post-op Assessment: Report given to RN and Post -op Vital signs reviewed and stable  Post vital signs: stable  Last Vitals:  Filed Vitals:   04/08/16 2030 04/08/16 2100  BP: 127/85 142/79  Pulse: 93 102  Temp: 37.1 C   Resp: 18 18    Last Pain:  Filed Vitals:   04/08/16 2252  PainSc: 7          Complications: No apparent anesthesia complications

## 2016-04-08 NOTE — Progress Notes (Signed)
Called by RN to see pt d/t decel Labor Progress Note Soumya Cordella RegisterM Mcculley is a 20 y.o. G2P1001 at 3638w3d presented for SOL, planning TOLAC  S:  Comfortable, recent epidural.   O:  BP 116/66 mmHg  Pulse 98  Temp(Src) 98.4 F (36.9 C) (Oral)  Resp 18  Ht 5\' 2"  (1.575 m)  Wt 88.451 kg (195 lb)  BMI 35.66 kg/m2  SpO2 100%  LMP  (LMP Unknown) EFM: baseline 145 bpm/mod variability/no accels/ + decel-prolonged-7 minutes Toco: 2-3 SVE: Dilation: 4 Effacement (%): 100 Station: -2 Presentation: Vertex Exam by:: Allysa Governale, CNM   A/P: 20 y.o. G2P1001 3438w3d  1. Labor: active 2. FWB: Cat II-improving after repositioning and o2; likely d/t hypotension 3. Pain: epidural 4. GBS positive Re-consented for TOLAC (consent signed per pt but not on file). Watch closely. Anticipate VBAC.  Donette LarryMelanie Cana Mignano, CNM 10:08 AM

## 2016-04-08 NOTE — H&P (Signed)
Rachel Aguirre RegisterM Brauner is a 20 y.o. female G2P1001 @ 40.3 wks, prev c/s x 1 for VBAC presenting for contractions that started yesterday. Maternal Medical History:  Reason for admission: Contractions.   Contractions: Onset was yesterday.   Frequency: regular.   Perceived severity is moderate.    Fetal activity: Perceived fetal activity is normal.   Last perceived fetal movement was within the past hour.    Prenatal complications: no prenatal complications Prenatal Complications - Diabetes: none.    OB History    Gravida Para Term Preterm AB TAB SAB Ectopic Multiple Living   2 1 1       0 1     No past medical history on file. Past Surgical History  Procedure Laterality Date  . Cesarean section N/A 03/27/2015    Procedure: CESAREAN SECTION;  Surgeon: Brock Badharles A Harper, MD;  Location: [facility];  Service: Obstetrics;  Laterality: N/A;   Family History: family history includes Hypertension in her mother. Social History:  reports that she has never smoked. She has never used smokeless tobacco. She reports that she does not drink alcohol or use illicit drugs.   Prenatal Transfer Tool  Maternal Diabetes: No Genetic Screening: Normal Maternal Ultrasounds/Referrals: Normal Fetal Ultrasounds or other Referrals:  None Maternal Substance Abuse:  No Significant Maternal Medications:  None Significant Maternal Lab Results:  GBS pos Other Comments:  None  Review of Systems  Constitutional: Negative.   HENT: Negative.   Eyes: Negative.   Respiratory: Negative.   Cardiovascular: Negative.   Gastrointestinal: Positive for abdominal pain.  Genitourinary: Negative.   Musculoskeletal: Negative.   Skin: Negative.   Neurological: Negative.   Endo/Heme/Allergies: Negative.   Psychiatric/Behavioral: Negative.     Dilation: 4 Effacement (%): 100 Exam by:: Darlene CNM unknown if currently breastfeeding. Maternal Exam:  Uterine Assessment: Contraction strength is moderate.  Abdomen: Surgical scars:  low transverse.   Fetal presentation: vertex  Introitus: Normal vulva. Normal vagina.  Amniotic fluid character: not assessed.  Pelvis: adequate for delivery.   Cervix: Cervix evaluated by digital exam.     Fetal Exam Fetal Monitor Review: Mode: ultrasound.   Variability: moderate (6-25 bpm).   Pattern: accelerations present.    Fetal State Assessment: Category I - tracings are normal.     Physical Exam  Constitutional: She is oriented to person, place, and time. She appears well-developed and well-nourished.  HENT:  Head: Normocephalic.  Eyes: Pupils are equal, round, and reactive to light.  Neck: Normal range of motion.  Cardiovascular: Normal rate, regular rhythm, normal heart sounds and intact distal pulses.   Respiratory: Effort normal and breath sounds normal.  GI: Soft. Bowel sounds are normal.  Genitourinary: Vagina normal and uterus normal.  Musculoskeletal: Normal range of motion.  Neurological: She is alert and oriented to person, place, and time. She has normal reflexes.  Skin: Skin is warm and dry.  Psychiatric: She has a normal mood and affect. Her behavior is normal. Judgment and thought content normal.    Prenatal labs: ABO, Rh: O/Positive/-- (01/17 0000) Antibody: Negative (01/17 0000) Rubella: Immune (01/17 0000) RPR: Nonreactive (01/17 0000)  HBsAg: Negative (01/17 0000)  HIV: Non-reactive (01/17 0000)  GBS: Positive (07/14 0000)   Assessment/Plan: SVE 4/90/-2, GBS pos, admit. Desires TOLAC, counseled.   Wyvonnia DuskyMarie Burnham Trost 04/08/2016, 7:47 AM

## 2016-04-08 NOTE — Anesthesia Procedure Notes (Signed)

## 2016-04-08 NOTE — Anesthesia Preprocedure Evaluation (Signed)

## 2016-04-08 NOTE — Progress Notes (Signed)
Labor Progress Note Ona Cordella RegisterM Bickham is a 20 y.o. G2P1001 at 2457w3d presented for labor  S:  Comfortable with epidural  O:  BP 112/61 mmHg  Pulse 67  Temp(Src) 99.6 F (37.6 C) (Oral)  Resp 18  Ht 5\' 2"  (1.575 m)  Wt 88.451 kg (195 lb)  BMI 35.66 kg/m2  SpO2 100%  LMP  (LMP Unknown) EFM: baseline 130 bpm/mod variability/+ accels/ + variable and early decels  Toco: 3-5  SVE: Dilation: 5.5 Effacement (%): 100 Station: -2 Presentation: Vertex Exam by:: Tarah Buboltz, CNM Pitocin: 2 mu/min  A/P: 20 y.o. G2P1001 4157w3d  1. Labor: active, slow progress 2. FWB: Cat II 3. Pain: epidural 4. GBS positive Continue Pitocin titration. Anticipate VBAC.  Donette LarryMelanie Ritesh Opara, CNM 6:31 PM

## 2016-04-08 NOTE — Progress Notes (Signed)
OB Note SVE unchanged and d/w pt that would recommend rpt c-section for arrest of dilatioon @ 9cm and some late decelerations are starting to appear. Pt amenable to this. Pt consented and OR called for urgent c-section will do azithro (if possible) in addition to 2gm ancef pre-op  Cornelia Copaharlie Henya Aguallo, Jr MD Attending Center for [facility](Faculty Practice)

## 2016-04-09 ENCOUNTER — Encounter (HOSPITAL_COMMUNITY): Payer: Self-pay | Admitting: *Deleted

## 2016-04-09 ENCOUNTER — Other Ambulatory Visit: Payer: Medicaid Other

## 2016-04-09 DIAGNOSIS — Z98891 History of uterine scar from previous surgery: Secondary | ICD-10-CM

## 2016-04-09 LAB — CBC
HEMATOCRIT: 28.8 % — AB (ref 36.0–46.0)
Hemoglobin: 9.4 g/dL — ABNORMAL LOW (ref 12.0–15.0)
MCH: 25.1 pg — ABNORMAL LOW (ref 26.0–34.0)
MCHC: 32.6 g/dL (ref 30.0–36.0)
MCV: 77 fL — AB (ref 78.0–100.0)
Platelets: 122 10*3/uL — ABNORMAL LOW (ref 150–400)
RBC: 3.74 MIL/uL — ABNORMAL LOW (ref 3.87–5.11)
RDW: 17 % — AB (ref 11.5–15.5)
WBC: 9.8 10*3/uL (ref 4.0–10.5)

## 2016-04-09 LAB — RPR: RPR: NONREACTIVE

## 2016-04-09 LAB — HIV ANTIBODY (ROUTINE TESTING W REFLEX): HIV Screen 4th Generation wRfx: NONREACTIVE

## 2016-04-09 MED ORDER — DIPHENHYDRAMINE HCL 25 MG PO CAPS: 25.0000 mg | ORAL_CAPSULE | Freq: Four times a day (QID) | ORAL | Status: DC | PRN

## 2016-04-09 MED ORDER — PROMETHAZINE HCL 25 MG/ML IJ SOLN
25.0000 mg | Freq: Once | INTRAMUSCULAR | Status: AC
  Administered 2016-04-09: 25 mg via INTRAVENOUS
  Filled 2016-04-09: qty 1

## 2016-04-09 MED ORDER — COCONUT OIL OIL: 1.0000 "application " | TOPICAL_OIL | Status: DC | PRN

## 2016-04-09 MED ORDER — OXYTOCIN 40 UNITS IN LACTATED RINGERS INFUSION - SIMPLE MED: 2.5000 [IU]/h | INTRAVENOUS | Status: AC

## 2016-04-09 MED ORDER — ACETAMINOPHEN 325 MG PO TABS: 650.0000 mg | ORAL_TABLET | ORAL | Status: DC | PRN

## 2016-04-09 MED ORDER — OXYCODONE HCL 5 MG PO TABS
5.0000 mg | ORAL_TABLET | ORAL | Status: DC | PRN
  Administered 2016-04-11: 5 mg via ORAL
  Filled 2016-04-09: qty 1

## 2016-04-09 MED ORDER — SIMETHICONE 80 MG PO CHEW
80.0000 mg | CHEWABLE_TABLET | Freq: Three times a day (TID) | ORAL | Status: DC
  Administered 2016-04-09 – 2016-04-11 (×8): 80 mg via ORAL
  Filled 2016-04-09 (×8): qty 1

## 2016-04-09 MED ORDER — LACTATED RINGERS IV SOLN
INTRAVENOUS | Status: DC
  Administered 2016-04-09: 02:00:00 via INTRAVENOUS

## 2016-04-09 MED ORDER — IBUPROFEN 600 MG PO TABS
600.0000 mg | ORAL_TABLET | Freq: Four times a day (QID) | ORAL | Status: DC
  Administered 2016-04-09 – 2016-04-11 (×9): 600 mg via ORAL
  Filled 2016-04-09 (×8): qty 1

## 2016-04-09 MED ORDER — WITCH HAZEL-GLYCERIN EX PADS: 1.0000 "application " | MEDICATED_PAD | CUTANEOUS | Status: DC | PRN

## 2016-04-09 MED ORDER — ZOLPIDEM TARTRATE 5 MG PO TABS: 5.0000 mg | ORAL_TABLET | Freq: Every evening | ORAL | Status: DC | PRN

## 2016-04-09 MED ORDER — DIBUCAINE 1 % RE OINT: 1.0000 "application " | TOPICAL_OINTMENT | RECTAL | Status: DC | PRN

## 2016-04-09 MED ORDER — FENTANYL CITRATE (PF) 100 MCG/2ML IJ SOLN
INTRAMUSCULAR | Status: AC
  Filled 2016-04-09: qty 2

## 2016-04-09 MED ORDER — MENTHOL 3 MG MT LOZG: 1.0000 | LOZENGE | OROMUCOSAL | Status: DC | PRN

## 2016-04-09 MED ORDER — SENNOSIDES-DOCUSATE SODIUM 8.6-50 MG PO TABS: 1.0000 | ORAL_TABLET | Freq: Every evening | ORAL | Status: DC | PRN

## 2016-04-09 MED ORDER — POLYSACCHARIDE IRON COMPLEX 150 MG PO CAPS
150.0000 mg | ORAL_CAPSULE | Freq: Every day | ORAL | Status: DC
  Administered 2016-04-09 – 2016-04-11 (×3): 150 mg via ORAL
  Filled 2016-04-09 (×3): qty 1

## 2016-04-09 MED ORDER — PRENATAL MULTIVITAMIN CH
1.0000 | ORAL_TABLET | Freq: Every day | ORAL | Status: DC
  Administered 2016-04-09 – 2016-04-11 (×3): 1 via ORAL
  Filled 2016-04-09 (×3): qty 1

## 2016-04-09 MED ORDER — ONDANSETRON HCL 4 MG PO TABS: 4.0000 mg | ORAL_TABLET | Freq: Three times a day (TID) | ORAL | Status: DC | PRN

## 2016-04-09 MED ORDER — FERROUS SULFATE 325 (65 FE) MG PO TABS
325.0000 mg | ORAL_TABLET | Freq: Two times a day (BID) | ORAL | Status: DC
  Administered 2016-04-09 – 2016-04-11 (×5): 325 mg via ORAL
  Filled 2016-04-09 (×5): qty 1

## 2016-04-09 MED ORDER — OXYCODONE HCL 5 MG PO TABS: 10.0000 mg | ORAL_TABLET | ORAL | Status: DC | PRN

## 2016-04-09 NOTE — Addendum Note (Signed)
Addendum  created 04/09/16 0954 by Elbert Ewingsolleen S Hafsah Hendler, CRNA   Modules edited: Clinical Notes   Clinical Notes:  File: [id number]

## 2016-04-09 NOTE — Lactation Note (Signed)
This note was copied from a baby's chart. Lactation Consultation Note Mom tired, talked a little about BF. BF her now 21 yr old for 2 months and stopped when returned to work. States this baby is latching great w/o difficulty and has BF a lot since birth. Mom plans on breast bottle feeding. Hasn't given this baby a bottle yet.  Mom wasn't in the mood to talk. Encouraged to call if needed assistance. Brochure not left d/t couldn't review it.  Patient Name: Rachel Aguirre Today's Date: 04/09/2016     Maternal Data    Feeding Feeding Type: Breast Fed Length of feed: 45 min  LATCH Score/Interventions                      Lactation Tools Discussed/Used     Consult Status      Sukaina Toothaker G 04/09/2016, 4:46 AM

## 2016-04-09 NOTE — Progress Notes (Signed)
Patient requested medication for her nausea. Notified L. Leftwich-Kirby, CNM. New order for zofran 4mg  q8 PO

## 2016-04-09 NOTE — Progress Notes (Signed)
UR chart review completed.  

## 2016-04-09 NOTE — Progress Notes (Signed)
Pt vomited again at 2155.No relief from zofran. Gave peppermint oil to sniff and notified L. Leftwich-Kirby, CNM. Vital signs within normal limits. New order of one-time dose of phenergan 25 mg iv.

## 2016-04-09 NOTE — Anesthesia Postprocedure Evaluation (Signed)
Anesthesia Post Note  Patient: Rachel Aguirre  Procedure(s) Performed: Procedure(s) (LRB): CESAREAN SECTION (N/A)  Patient location during evaluation: Mother Baby Anesthesia Type: Epidural Level of consciousness: awake, awake and alert and oriented Pain management: pain level controlled Vital Signs Assessment: post-procedure vital signs reviewed and stable Respiratory status: spontaneous breathing, nonlabored ventilation and respiratory function stable Cardiovascular status: stable Postop Assessment: no headache, no backache, patient able to bend at knees, no signs of nausea or vomiting and adequate PO intake Anesthetic complications: no     Last Vitals:  Filed Vitals:   04/09/16 0330 04/09/16 0430  BP: 129/81 127/79  Pulse: 91 70  Temp: 36.7 C 36.9 C  Resp: 18 18    Last Pain:  Filed Vitals:   04/09/16 0917  PainSc: 3    Pain Goal:                 Hikari Tripp

## 2016-04-09 NOTE — Progress Notes (Signed)
Subjective: Postpartum Day #1: Repeat Cesarean Delivery failed TOLAC Patient reports incisional pain, tolerating PO and no problems voiding.    Objective: Vital signs in last 24 hours: Temp:  [97.5 F (36.4 C)-99.6 F (37.6 C)] 98.4 F (36.9 C) (07/18 0430) Pulse Rate:  [62-160] 70 (07/18 0430) Resp:  [14-19] 18 (07/18 0430) BP: (74-160)/(40-142) 127/79 mmHg (07/18 0430) SpO2:  [96 %-100 %] 96 % (07/18 0430)  Physical Exam:  General: alert, cooperative and no distress Lochia: appropriate Uterine Fundus: firm Incision: no significant drainage DVT Evaluation: No evidence of DVT seen on physical exam. No cords or calf tenderness. No significant calf/ankle edema.   Recent Labs  04/08/16 0813 04/09/16 0535  HGB 12.7 9.4*  HCT 37.7 28.8*    Assessment/Plan: Status post Cesarean section. Doing well postoperatively.  Continue current care. Anemia: iron started.   Rachel Aguirre, CNM 04/09/2016, 8:37 AM

## 2016-04-10 MED ORDER — KETOROLAC TROMETHAMINE 30 MG/ML IJ SOLN
30.0000 mg | Freq: Once | INTRAMUSCULAR | Status: AC
  Administered 2016-04-10: 30 mg via INTRAVENOUS

## 2016-04-10 MED ORDER — TETANUS-DIPHTH-ACELL PERTUSSIS 5-2.5-18.5 LF-MCG/0.5 IM SUSP
0.5000 mL | Freq: Once | INTRAMUSCULAR | Status: AC
  Administered 2016-04-10: 0.5 mL via INTRAMUSCULAR

## 2016-04-10 NOTE — Progress Notes (Signed)
Notified L.  leftwich-Kirby to request instatement of toradol iv order because patient has been nauseated and vomiting and do not want to give PO yet. New order for 30 mg iv one-time dose.

## 2016-04-10 NOTE — Lactation Note (Signed)
This note was copied from a baby's chart. Lactation Consultation Note  Patient Name: Rachel Aguirre Today's Date: 04/10/2016 Reason for consult: Follow-up assessment Baby at 46 hr of life. Mom reports baby is latching well, denies breast/nipple pain, voiced no concerns. She is not sure that she wants to keep bf. She is going back to work and knows she will not be able to keep up with pumping. Discussed breast changes and supply/demand. She has a Harmony in the room and has a DEBP at home that she might use. She is aware of lactation services and support group. She will call as needed.   Maternal Data Has patient been taught Hand Expression?: Yes Does the patient have breastfeeding experience prior to this delivery?: Yes  Feeding Feeding Type: Breast Fed  LATCH Score/Interventions Latch: Grasps breast easily, tongue down, lips flanged, rhythmical sucking.  Audible Swallowing: Spontaneous and intermittent Intervention(s): Skin to skin  Type of Nipple: Everted at rest and after stimulation  Comfort (Breast/Nipple): Soft / non-tender     Hold (Positioning): Assistance needed to correctly position infant at breast and maintain latch.  LATCH Score: 9  Lactation Tools Discussed/Used WIC Program: Yes   Consult Status Consult Status: PRN    [name] 04/10/2016, 8:53 PM

## 2016-04-10 NOTE — Progress Notes (Signed)
Subjective: Postpartum Day #2: Cesarean Delivery Patient reports nausea, vomiting, incisional pain, tolerating PO, + flatus and no problems voiding.  Had an episode of vomiting yesterday, nothing since Phenergan was given.    Objective: Vital signs in last 24 hours: Temp:  [97.8 F (36.6 C)-99.3 F (37.4 C)] 98.7 F (37.1 C) (07/19 0605) Pulse Rate:  [72-84] 76 (07/19 0605) Resp:  [18-20] 18 (07/19 0605) BP: (117-133)/(56-78) 117/56 mmHg (07/19 0605) SpO2:  [99 %-100 %] 100 % (07/18 2155)  Physical Exam:  General: alert, cooperative and no distress Lochia: appropriate Uterine Fundus: firm Incision: no significant drainage DVT Evaluation: No evidence of DVT seen on physical exam. No cords or calf tenderness. No significant calf/ankle edema.   Recent Labs  04/08/16 0813 04/09/16 0535  HGB 12.7 9.4*  HCT 37.7 28.8*    Assessment/Plan: Status post Cesarean section. Doing well postoperatively.  Continue current care.  Dressing change ordered.   Birth control: planning depo injection prior to discharge home.    Rachel Aguirre, CNM 04/10/2016, 8:33 AM

## 2016-04-11 ENCOUNTER — Encounter (HOSPITAL_COMMUNITY): Payer: Self-pay | Admitting: *Deleted

## 2016-04-11 ENCOUNTER — Encounter: Payer: Self-pay | Admitting: *Deleted

## 2016-04-11 MED ORDER — OXYCODONE-ACETAMINOPHEN 5-325 MG PO TABS: 1.0000 | ORAL_TABLET | ORAL | Status: DC | PRN

## 2016-04-11 MED ORDER — POLYSACCHARIDE IRON COMPLEX 150 MG PO CAPS: 150.0000 mg | ORAL_CAPSULE | Freq: Every day | ORAL | Status: DC

## 2016-04-11 MED ORDER — MEDROXYPROGESTERONE ACETATE 150 MG/ML IM SUSP
150.0000 mg | Freq: Once | INTRAMUSCULAR | Status: DC
  Filled 2016-04-11: qty 1

## 2016-04-11 MED ORDER — IBUPROFEN 600 MG PO TABS: 600.0000 mg | ORAL_TABLET | Freq: Four times a day (QID) | ORAL | Status: DC

## 2016-04-11 MED ORDER — SENNOSIDES-DOCUSATE SODIUM 8.6-50 MG PO TABS: 1.0000 | ORAL_TABLET | Freq: Two times a day (BID) | ORAL | Status: DC

## 2016-04-11 MED ORDER — MEDROXYPROGESTERONE ACETATE 150 MG/ML IM SUSP: 150.0000 mg | INTRAMUSCULAR | Status: DC

## 2016-04-11 NOTE — Discharge Summary (Signed)
Obstetric Discharge Summary Reason for Admission: induction of labor, cesarean section and failed TOLAC Prenatal Procedures: NST and ultrasound Intrapartum Procedures: cesarean: low cervical, transverse Postpartum Procedures: none Complications-Operative and Postpartum: none HEMOGLOBIN  Date Value Ref Range Status  04/09/2016 9.4* 12.0 - 15.0 g/dL Final    Comment:    DELTA CHECK NOTED REPEATED TO VERIFY    HCT  Date Value Ref Range Status  04/09/2016 28.8* 36.0 - 46.0 % Final    Physical Exam:  General: alert, cooperative and no distress Lochia: appropriate Uterine Fundus: firm Incision: no significant drainage, no dehiscence, no significant erythema DVT Evaluation: No evidence of DVT seen on physical exam. No cords or calf tenderness. No significant calf/ankle edema.  Discharge Diagnoses: Term Pregnancy-delivered  Discharge Information: Date: 04/11/2016 Activity: pelvic rest Diet: routine Medications: PNV, Ibuprofen, Colace, Iron and Percocet Condition: stable Instructions: refer to practice specific booklet Discharge to: home Follow-up Information    [facility] Schedule an appointment as soon as possible for a visit in 1 week.   Why:  check incision   Contact information:   [address] [place] [phone]      Newborn Data: Live born female  Birth Weight: 7 lb 12.7 oz (3535 g) APGAR: 3, 8  Home with mother.  Roe Coombsachelle A Cobi Delph, CNM 04/11/2016, 9:00 AM

## 2016-04-11 NOTE — Lactation Note (Signed)
This note was copied from a baby's chart. Lactation Consultation Note  Patient Name: Rachel Aguirre Today's Date: 04/11/2016   Baby 61 hours old. Attempted to visit mom prior to discharge, but mom sleeping.  Maternal Data    Feeding    LATCH Score/Interventions                      Lactation Tools Discussed/Used     Consult Status      Rachel Aguirre, [name] 04/11/2016, 11:16 AM

## 2016-04-11 NOTE — Discharge Instructions (Signed)
Breast Pumping Tips °If you are breastfeeding, there may be times when you cannot feed your baby directly. Returning to work or going on a trip are common examples. Pumping allows you to store breast milk and feed it to your baby later.  °You may not get much milk when you first start to pump. Your breasts should start to make more after a few days. If you pump at the times you usually feed your baby, you may be able to keep making enough milk to feed your baby without also using formula. The more often you pump, the more milk you will produce.  °WHEN SHOULD I PUMP?  °· You can begin to pump soon after delivery. However, some experts recommend waiting about 4 weeks before giving your infant a bottle to make sure breastfeeding is going well.  °· If you plan to return to work, begin pumping a few weeks before. This will help you develop techniques that work best for you. It also lets you build up a supply of breast milk.   °· When you are with your infant, feed on demand and pump after each feeding.   °· When you are away from your infant for several hours, pump for about 15 minutes every 2-3 hours. Pump both breasts at the same time if you can.   °· If your infant has a formula feeding, make sure to pump around the same time.     °· If you drink any alcohol, wait 2 hours before pumping.   °HOW DO I PREPARE TO PUMP? °Your let-down reflex is the natural reaction to stimulation that makes your breast milk flow. It is easier to stimulate this reflex when you are relaxed. Find relaxation techniques that work for you. If you have difficulty with your let-down reflex, try these methods:  °· Smell one of your infant's blankets or an item of clothing.   °· Look at a picture or video of your infant.   °· Sit in a quiet, private space.   °· Massage the breast you plan to pump.   °· Place soothing warmth on the breast.   °· Play relaxing music.   °WHAT ARE SOME GENERAL BREAST PUMPING TIPS? °· Wash your hands before you pump. You  do not need to wash your nipples or breasts. °· There are three ways to pump. °¨ You can use your hand to massage and compress your breast. °¨ You can use a handheld manual pump. °¨ You can use an electric pump.   °· Make sure the suction cup (flange) on the breast pump is the right size. Place the flange directly over the nipple. If it is the wrong size or placed the wrong way, it may be painful and cause nipple damage.   °· If pumping is uncomfortable, apply a small amount of purified or modified lanolin to your nipple and areola. °· If you are using an electric pump, adjust the speed and suction power to be more comfortable. °· If pumping is painful or if you are not getting very much milk, you may need a different type of pump. A lactation consultant can help you determine what type of pump to use.   °· Keep a full water bottle near you at all times. Drinking lots of fluid helps you make more milk.  °· You can store your milk to use later. Pumped breast milk can be stored in a sealable, sterile container or plastic bag. Label all stored breast milk with the date you pumped it. °¨ Milk can stay out at room temperature for up to 8 hours. °¨   You can store your milk in the refrigerator for up to 8 days. °¨ You can store your milk in the freezer for 3 months. Thaw frozen milk using warm water. Do not put it in the microwave. °· Do not smoke. Smoking can lower your milk supply and harm your infant. If you need help quitting, ask your health care provider to recommend a program.   °WHEN SHOULD I CALL MY HEALTH CARE PROVIDER OR A LACTATION CONSULTANT? °· You are having trouble pumping. °· You are concerned that you are not making enough milk. °· You have nipple pain, soreness, or redness. °· You want to use birth control. Birth control pills may lower your milk supply. Talk to your health care provider about your options. °  °This information is not intended to replace advice given to you by your health care provider.  Make sure you discuss any questions you have with your health care provider. °  °Document Released: 02/27/2010 Document Revised: 09/14/2013 Document Reviewed: 07/02/2013 °Elsevier Interactive Patient Education ©2016 [employer] Inc. ° °

## 2016-04-11 NOTE — Progress Notes (Signed)
Subjective: Postpartum Day #3: Cesarean Delivery Patient reports incisional pain, tolerating PO, + flatus and no problems voiding.    Objective: Vital signs in last 24 hours: Temp:  [97.4 F (36.3 C)-98 F (36.7 C)] 98 F (36.7 C) (07/20 0515) Pulse Rate:  [68-73] 68 (07/20 0515) Resp:  [18] 18 (07/20 0515) BP: (113-134)/(58-62) 113/62 mmHg (07/20 0515)  Physical Exam:  General: alert, cooperative and no distress Lochia: appropriate Uterine Fundus: firm Incision: no significant drainage, no dehiscence, no significant erythema DVT Evaluation: No evidence of DVT seen on physical exam. Negative Homan's sign. No cords or calf tenderness. No significant calf/ankle edema.   Recent Labs  04/09/16 0535  HGB 9.4*  HCT 28.8*    Assessment/Plan: Status post Cesarean section. Doing well postoperatively.  Discharge home.  Depo injection prior to discharge.   Roe Coombsachelle A Chessica Audia, CNM 04/11/2016, 8:53 AM

## 2016-04-12 ENCOUNTER — Inpatient Hospital Stay (HOSPITAL_COMMUNITY): Admission: RE | Admit: 2016-04-12 | Payer: Medicaid Other | Source: Ambulatory Visit

## 2016-04-16 ENCOUNTER — Ambulatory Visit (INDEPENDENT_AMBULATORY_CARE_PROVIDER_SITE_OTHER): Payer: Medicaid Other | Admitting: Obstetrics and Gynecology

## 2016-04-16 ENCOUNTER — Encounter: Payer: Self-pay | Admitting: Obstetrics and Gynecology

## 2016-04-16 VITALS — BP 110/77 | HR 73 | Ht 64.0 in | Wt 180.0 lb

## 2016-04-16 DIAGNOSIS — Z9889 Other specified postprocedural states: Secondary | ICD-10-CM | POA: Diagnosis not present

## 2016-04-16 DIAGNOSIS — Z98891 History of uterine scar from previous surgery: Secondary | ICD-10-CM

## 2016-04-16 MED ORDER — PSYLLIUM 28 % PO PACK: 1.0000 | PACK | Freq: Every day | ORAL | 0 refills | Status: DC

## 2016-04-16 NOTE — Progress Notes (Signed)
Postpartum Incision Check Clinic: [facility] 04/16/2016  Subjective:     Rachel Aguirre is a 20 y.o. [id number] here for PP incision check. She is s/p 7/17 failed TOLAC and classical c-section. Preg was uncomplicated. She was discharged to home on POD#3. She is doing well and denies any fevers, chills, nausea, vomiting. Her mood is good and she has some constipation from the iron and narcotics.   Objective:    BP 110/77 (BP Location: Left Arm)   Pulse 73   Ht 5\' 4"  (1.626 m)   Wt 180 lb (81.6 kg)   LMP  (LMP Unknown)   Breastfeeding? Yes Comment: breast and bottle  BMI 30.90 kg/m  NAD Abdomen: soft, NTTP, ND. Dressing and steri strips removed. Incision c/d/i with no e/o separation or infection. Incision cleaned with alcohol and betadine.   Assessment:   PP and pt doing well  Plan:   Wound care instructions given.  Patient desires depo. Rx already sent in and pt to bring it with her to PP visit D/w patient nature of c-section and that I'd recommend not getting pregnant for at least two years and to treat her incision like a classical and to get a repeat c-section three weeks before her due date  Cornelia Copa MD Attending Center for [employer] [facility])

## 2016-05-14 ENCOUNTER — Ambulatory Visit: Payer: Medicaid Other | Admitting: Obstetrics & Gynecology

## 2016-05-28 ENCOUNTER — Ambulatory Visit (INDEPENDENT_AMBULATORY_CARE_PROVIDER_SITE_OTHER): Payer: Medicaid Other | Admitting: Obstetrics and Gynecology

## 2016-05-28 ENCOUNTER — Encounter: Payer: Self-pay | Admitting: Obstetrics and Gynecology

## 2016-05-28 ENCOUNTER — Encounter: Payer: Self-pay | Admitting: *Deleted

## 2016-05-28 NOTE — Progress Notes (Signed)
Subjective:     Patient ID: Rachel IhaMemory M Aguirre, female   DOB: 1996/06/26, 20 y.o.   MRN: [id number]  HPI. Rachel Aguirre is here for routine PPV. Rachel Aguirre has no complaints today. Bleeding has stopped. Rachel Aguirre denies any bowels or bladder dysfunction. [name]. Not sexual active partner/FOB is incarcerated. Tolerating regular diet.    Review of Systems  Constitutional: Negative.   Respiratory: Negative.   Cardiovascular: Negative.   Gastrointestinal: Negative.   Genitourinary: Negative.        Objective:   Physical Exam  Constitutional: Rachel Aguirre appears well-developed and well-nourished.  Cardiovascular: Normal rate and regular rhythm.   Pulmonary/Chest: Effort normal and breath sounds normal.  Abdominal: Soft. Bowel sounds are normal.  Incision is well healed  Genitourinary: Vagina normal and uterus normal.       Assessment:     PPV Contraceptive management    Plan:     Pt is to return to normal activities as tolerated. Rachel Aguirre is to return to clinic with her next cycle for her Depo Provera injection. Advised to used condoms for now. Pt is reminded to avoid pregnancy and x 2 years and to delivery at 37 weeks due to nature of repair of her last c section. This has also  Been discussed with pt by Dr Vergie LivingPickens.

## 2016-10-02 ENCOUNTER — Encounter (HOSPITAL_COMMUNITY): Payer: Self-pay

## 2016-10-02 ENCOUNTER — Emergency Department (HOSPITAL_COMMUNITY): Payer: Medicaid Other

## 2016-10-02 DIAGNOSIS — Z79899 Other long term (current) drug therapy: Secondary | ICD-10-CM | POA: Insufficient documentation

## 2016-10-02 DIAGNOSIS — J069 Acute upper respiratory infection, unspecified: Secondary | ICD-10-CM | POA: Diagnosis not present

## 2016-10-02 DIAGNOSIS — R05 Cough: Secondary | ICD-10-CM | POA: Diagnosis present

## 2016-10-02 LAB — RAPID STREP SCREEN (MED CTR MEBANE ONLY): Streptococcus, Group A Screen (Direct): NEGATIVE

## 2016-10-02 NOTE — ED Triage Notes (Signed)
Pt complains of general body aches, chills and a sore throat for three days

## 2016-10-03 ENCOUNTER — Emergency Department (HOSPITAL_COMMUNITY)
Admission: EM | Admit: 2016-10-03 | Discharge: 2016-10-03 | Disposition: A | Discharge status: Home / Self Care | Payer: Medicaid Other | Attending: Emergency Medicine | Admitting: Emergency Medicine

## 2016-10-03 DIAGNOSIS — J069 Acute upper respiratory infection, unspecified: Secondary | ICD-10-CM

## 2016-10-03 DIAGNOSIS — B9789 Other viral agents as the cause of diseases classified elsewhere: Secondary | ICD-10-CM

## 2016-10-03 MED ORDER — BUTALBITAL-APAP-CAFFEINE 50-325-40 MG PO TABS: 1.0000 | ORAL_TABLET | Freq: Three times a day (TID) | ORAL | 0 refills | Status: AC | PRN

## 2016-10-03 MED ORDER — IBUPROFEN 600 MG PO TABS: 600.0000 mg | ORAL_TABLET | Freq: Four times a day (QID) | ORAL | 0 refills | Status: DC | PRN

## 2016-10-03 MED ORDER — FLUTICASONE PROPIONATE 50 MCG/ACT NA SUSP: 2.0000 | Freq: Every day | NASAL | 0 refills | Status: DC

## 2016-10-03 MED ORDER — BENZONATATE 100 MG PO CAPS
100.0000 mg | ORAL_CAPSULE | Freq: Once | ORAL | Status: AC
  Administered 2016-10-03: 100 mg via ORAL
  Filled 2016-10-03: qty 1

## 2016-10-03 MED ORDER — BENZONATATE 100 MG PO CAPS: 100.0000 mg | ORAL_CAPSULE | Freq: Three times a day (TID) | ORAL | 0 refills | Status: DC | PRN

## 2016-10-03 MED ORDER — BUTALBITAL-APAP-CAFFEINE 50-325-40 MG PO TABS
2.0000 | ORAL_TABLET | Freq: Once | ORAL | Status: AC
  Administered 2016-10-03: 2 via ORAL
  Filled 2016-10-03: qty 2

## 2016-10-03 NOTE — ED Provider Notes (Signed)
[facility]-EMERGENCY DEPT Provider Note   CSN: [id number] Arrival date & time: 10/02/16  2224   By signing my name below, I, Clarisse Gouge, attest that this documentation has been prepared under the direction and in the presence of [employer], PA-C. Electronically Signed: Clarisse Gouge, Scribe. 10/03/16. 1:49 AM.    History   Chief Complaint Chief Complaint  Patient presents with  . URI    The history is provided by the patient and medical records. No language interpreter was used.    HPI Comments: Rachel Aguirre is a 21 y.o. female who presents to the Emergency Department complaining of a persistent headache x 2 days. States her headache is in the back of the head, and has mildly subsided since onset on evaluation. Pt notes sore throat, congestion, rhinorrhea, and chills. She reports additional productive cough that has subsided. Pt states she took Excedrin and cough drops with temporary relief to respective headache and sore throat. No other symptoms disclosed. No sick contacts.   Past Medical History:  Diagnosis Date  . Infection    UTI    Patient Active Problem List   Diagnosis Date Noted  . Postpartum care and examination 05/28/2016  . History of classical cesarean section 04/09/2016    Past Surgical History:  Procedure Laterality Date  . CESAREAN SECTION N/A 03/27/2015   Procedure: CESAREAN SECTION;  Surgeon: Brock Bad, MD;  Location: [facility] ORS;  Service: Obstetrics;  Laterality: N/A;  . CESAREAN SECTION N/A 04/08/2016   Procedure: CESAREAN SECTION;  Surgeon: Madill Bing, MD;  Location: [facility] BIRTHING SUIT[facility];  Service: Obstetrics;  Laterality: N/A;  . WISDOM TOOTH EXTRACTION      OB History    Gravida Para Term Preterm AB Living   2 2 2     2    SAB TAB Ectopic Multiple Live Births         0 2       Home Medications    Prior to Admission medications   Medication Sig Start Date End Date Taking? Authorizing Provider  benzonatate (TESSALON) 100 MG capsule Take  1 capsule (100 mg total) by mouth 3 (three) times daily as needed for cough. 10/03/16   Antony Madura, PA-C  butalbital-acetaminophen-caffeine (FIORICET, ESGIC) [phone] MG tablet Take 1-2 tablets by mouth every 8 (eight) hours as needed for headache. 10/03/16 10/03/17  Antony Madura, PA-C  fluticasone [facility]) 50 MCG/ACT nasal spray Place 2 sprays into both nostrils daily. 10/03/16   Antony Madura, PA-C  ibuprofen (ADVIL,MOTRIN) 600 MG tablet Take 1 tablet (600 mg total) by mouth every 6 (six) hours as needed. 10/03/16   Antony Madura, PA-C  iron polysaccharides (NIFEREX) 150 MG capsule Take 1 capsule (150 mg total) by mouth daily. 04/11/16   Rachelle A Denney, CNM  medroxyPROGESTERone (DEPO-PROVERA) 150 MG/ML injection Inject 1 mL (150 mg total) into the muscle every 3 (three) months. Patient not taking: Reported on 04/16/2016 04/11/16   Roe Coombs, CNM  Prenat w/o A Vit-FeFum-FePo-FA (CONCEPT OB) 130-92.4-1 MG CAPS Take 1 tablet by mouth daily. 09/19/15   Dorathy Kinsman, CNM    Family History Family History  Problem Relation Age of Onset  . Hypertension Mother     Social History Social History  Substance Use Topics  . Smoking status: Never Smoker  . Smokeless tobacco: Never Used  . Alcohol use No     Allergies   Patient has no known allergies.   Review of Systems Review of Systems A complete 10 system  review of systems was obtained and all systems are negative except as noted in the HPI and PMH.     Physical Exam Updated Vital Signs BP 123/73 (BP Location: Left Arm)   Pulse 105   Temp 98.2 F (36.8 C) (Oral)   Resp 18   Ht 5\' 4"  (1.626 m)   Wt 168 lb (76.2 kg)   LMP 09/18/2016   SpO2 100%   BMI 28.84 kg/m   Physical Exam  Constitutional: She is oriented to person, place, and time. She appears well-developed and well-nourished. No distress.  Nontoxic appearing and in no distress  HENT:  Head: Normocephalic and atraumatic.  Right Ear: Tympanic membrane, external ear and  ear canal normal.  Left Ear: Tympanic membrane, external ear and ear canal normal.  Mouth/Throat: Oropharynx is clear and moist.  Audible nasal congestion. Oropharynx clear. Patient tolerating secretions without difficulty.  Eyes: Conjunctivae and EOM are normal. No scleral icterus.  Neck: Normal range of motion.  No nuchal rigidity or meningismus  Cardiovascular: Normal rate, regular rhythm and intact distal pulses.   Pulmonary/Chest: Effort normal. No respiratory distress. She has no wheezes. She has no rales.  Lungs clear to auscultation bilaterally. Chest expansion symmetric  Musculoskeletal: Normal range of motion.  Neurological: She is alert and oriented to person, place, and time. No cranial nerve deficit. She exhibits normal muscle tone. Coordination normal.  Skin: Skin is warm and dry. No rash noted. She is not diaphoretic. No erythema. No pallor.  Psychiatric: She has a normal mood and affect. Her behavior is normal.  Nursing note and vitals reviewed.    ED Treatments / Results  DIAGNOSTIC STUDIES: Oxygen Saturation is 100% on RA, normal by my interpretation.    COORDINATION OF CARE: 1:49 AM Discussed treatment plan with pt at bedside and pt agreed to plan.   Labs (all labs ordered are listed, but only abnormal results are displayed) Labs Reviewed  RAPID STREP SCREEN (NOT AT [facility])  CULTURE, GROUP A STREP [facility])    EKG  EKG Interpretation None       Radiology Dg Chest 2 View  Result Date: 10/02/2016 CLINICAL DATA:  Cough, headache, chills, hot flashes, and shortness of breath for 2 days. EXAM: CHEST  2 VIEW COMPARISON:  None. FINDINGS: Shallow inspiration. Normal heart size and pulmonary vascularity. No focal airspace disease or consolidation in the lungs. No blunting of costophrenic angles. No pneumothorax. Mediastinal contours appear intact. IMPRESSION: No active cardiopulmonary disease. Electronically Signed   By: Burman Nieves M.D.   On: 10/02/2016 23:30     Procedures Procedures (including critical care time)  Medications Ordered in ED Medications  butalbital-acetaminophen-caffeine (FIORICET, ESGIC) 50-325-40 MG per tablet 2 tablet (2 tablets Oral Given 10/03/16 0157)  benzonatate (TESSALON) capsule 100 mg (100 mg Oral Given 10/03/16 0158)     Initial Impression / Assessment and Plan / ED Course  I have reviewed the triage vital signs and the nursing notes.  Pertinent labs & imaging results that were available during my care of the patient were reviewed by me and considered in my medical decision making (see chart for details).  Will order medications in pursuit of symptomatic care.  Clinical Course     Pt CXR negative for acute infiltrate. Patient's symptoms are consistent with URI, likely viral etiology. Discussed that antibiotics are not indicated for viral infections. Pt will be discharged with symptomatic treatment. She verbalizes understanding and is agreeable with plan. Pt is hemodynamically stable and in NAD prior  to discharge.   Final Clinical Impressions(s) / ED Diagnoses   Final diagnoses:  Viral URI with cough    New Prescriptions New Prescriptions   BENZONATATE (TESSALON) 100 MG CAPSULE    Take 1 capsule (100 mg total) by mouth 3 (three) times daily as needed for cough.   BUTALBITAL-ACETAMINOPHEN-CAFFEINE (FIORICET, ESGIC) 50-325-40 MG TABLET    Take 1-2 tablets by mouth every 8 (eight) hours as needed for headache.   FLUTICASONE (FLONASE) 50 MCG/ACT NASAL SPRAY    Place 2 sprays into both nostrils daily.   IBUPROFEN (ADVIL,MOTRIN) 600 MG TABLET    Take 1 tablet (600 mg total) by mouth every 6 (six) hours as needed.   I personally performed the services described in this documentation, which was scribed in my presence. The recorded information has been reviewed and is accurate.      Antony MaduraKelly Joanmarie Tsang, PA-C 10/03/16 0221    Tilden FossaElizabeth Rees, MD 10/12/16 [id number]

## 2016-10-05 LAB — CULTURE, GROUP A STREP (THRC)

## 2017-10-26 ENCOUNTER — Emergency Department (HOSPITAL_COMMUNITY): Payer: Medicaid Other

## 2017-10-26 ENCOUNTER — Emergency Department (HOSPITAL_COMMUNITY)
Admission: EM | Admit: 2017-10-26 | Discharge: 2017-10-26 | Disposition: A | Discharge status: Home / Self Care | Payer: Medicaid Other | Attending: Emergency Medicine | Admitting: Emergency Medicine

## 2017-10-26 ENCOUNTER — Encounter (HOSPITAL_COMMUNITY): Payer: Self-pay | Admitting: Emergency Medicine

## 2017-10-26 ENCOUNTER — Other Ambulatory Visit: Payer: Self-pay

## 2017-10-26 DIAGNOSIS — R1011 Right upper quadrant pain: Secondary | ICD-10-CM | POA: Diagnosis not present

## 2017-10-26 DIAGNOSIS — Z3202 Encounter for pregnancy test, result negative: Secondary | ICD-10-CM | POA: Diagnosis not present

## 2017-10-26 DIAGNOSIS — K8020 Calculus of gallbladder without cholecystitis without obstruction: Secondary | ICD-10-CM

## 2017-10-26 LAB — COMPREHENSIVE METABOLIC PANEL
ALK PHOS: 82 U/L (ref 38–126)
ALT: 22 U/L (ref 14–54)
AST: 29 U/L (ref 15–41)
Albumin: 4.2 g/dL (ref 3.5–5.0)
Anion gap: 8 (ref 5–15)
BILIRUBIN TOTAL: 0.5 mg/dL (ref 0.3–1.2)
BUN: 9 mg/dL (ref 6–20)
CALCIUM: 9.1 mg/dL (ref 8.9–10.3)
CO2: 25 mmol/L (ref 22–32)
Chloride: 104 mmol/L (ref 101–111)
Creatinine, Ser: 0.6 mg/dL (ref 0.44–1.00)
Glucose, Bld: 123 mg/dL — ABNORMAL HIGH (ref 65–99)
Potassium: 3.8 mmol/L (ref 3.5–5.1)
Sodium: 137 mmol/L (ref 135–145)
Total Protein: 7.7 g/dL (ref 6.5–8.1)

## 2017-10-26 LAB — CBC WITH DIFFERENTIAL/PLATELET
Basophils Absolute: 0 10*3/uL (ref 0.0–0.1)
Basophils Relative: 0 %
Eosinophils Absolute: 0 10*3/uL (ref 0.0–0.7)
Eosinophils Relative: 0 %
HCT: 33.7 % — ABNORMAL LOW (ref 36.0–46.0)
HEMOGLOBIN: 11.2 g/dL — AB (ref 12.0–15.0)
LYMPHS ABS: 1.5 10*3/uL (ref 0.7–4.0)
LYMPHS PCT: 23 %
MCH: 27.5 pg (ref 26.0–34.0)
MCHC: 33.2 g/dL (ref 30.0–36.0)
MCV: 82.8 fL (ref 78.0–100.0)
Monocytes Absolute: 0.2 10*3/uL (ref 0.1–1.0)
Monocytes Relative: 4 %
NEUTROS PCT: 73 %
Neutro Abs: 4.5 10*3/uL (ref 1.7–7.7)
Platelets: 290 10*3/uL (ref 150–400)
RBC: 4.07 MIL/uL (ref 3.87–5.11)
RDW: 14.1 % (ref 11.5–15.5)
WBC: 6.2 10*3/uL (ref 4.0–10.5)

## 2017-10-26 LAB — PREGNANCY, URINE: PREG TEST UR: NEGATIVE

## 2017-10-26 LAB — LIPASE, BLOOD: LIPASE: 31 U/L (ref 11–51)

## 2017-10-26 MED ORDER — FAMOTIDINE 20 MG PO TABS: 20.0000 mg | ORAL_TABLET | Freq: Two times a day (BID) | ORAL | 0 refills | Status: DC

## 2017-10-26 MED ORDER — ONDANSETRON HCL 4 MG/2ML IJ SOLN: 4.0000 mg | Freq: Once | INTRAMUSCULAR | Status: DC

## 2017-10-26 MED ORDER — HYDROMORPHONE HCL 1 MG/ML IJ SOLN: 0.5000 mg | Freq: Once | INTRAMUSCULAR | Status: DC

## 2017-10-26 MED ORDER — OXYCODONE-ACETAMINOPHEN 5-325 MG PO TABS
2.0000 | ORAL_TABLET | Freq: Once | ORAL | Status: AC
Start: 2017-10-26 — End: 2017-10-26
  Administered 2017-10-26: 2 via ORAL
  Filled 2017-10-26: qty 2

## 2017-10-26 MED ORDER — HYDROCODONE-ACETAMINOPHEN 5-325 MG PO TABS: 2.0000 | ORAL_TABLET | ORAL | 0 refills | Status: DC | PRN

## 2017-10-26 MED ORDER — GI COCKTAIL ~~LOC~~
30.0000 mL | Freq: Once | ORAL | Status: AC
  Administered 2017-10-26: 30 mL via ORAL
  Filled 2017-10-26: qty 30

## 2017-10-26 NOTE — ED Notes (Signed)
Patient transported to X-ray 

## 2017-10-26 NOTE — ED Provider Notes (Signed)
[facility]-EMERGENCY DEPT Provider Note   CSN: [id number] Arrival date & time: 10/26/17  0608     History   Chief Complaint Chief Complaint  Patient presents with  . Chest Pain    HPI Rachel Aguirre is a 22 y.o. female.  The history is provided by the patient. No language interpreter was used.  Abdominal Pain   This is a new problem. The problem occurs constantly. The problem has been gradually worsening. The pain is associated with eating. The pain is located in the RUQ. The pain is severe. Pertinent negatives include fever. Nothing aggravates the symptoms. Nothing relieves the symptoms.  Pt complains of pain in her right upper abdomen.  Pt reports pain began after eating chick filet.    Past Medical History:  Diagnosis Date  . Infection    UTI    Patient Active Problem List   Diagnosis Date Noted  . Postpartum care and examination 05/28/2016  . History of classical cesarean section 04/09/2016    Past Surgical History:  Procedure Laterality Date  . CESAREAN SECTION N/A 03/27/2015   Procedure: CESAREAN SECTION;  Surgeon: Brock Bad, MD;  Location: [facility];  Service: Obstetrics;  Laterality: N/A;  . CESAREAN SECTION N/A 04/08/2016   Procedure: CESAREAN SECTION;  Surgeon: Rowley Bing, MD;  Location: [facility] BIRTHING SUITES;  Service: Obstetrics;  Laterality: N/A;  . WISDOM TOOTH EXTRACTION      OB History    Gravida Para Term Preterm AB Living   2 2 2     2    SAB TAB Ectopic Multiple Live Births         0 2       Home Medications    Prior to Admission medications   Medication Sig Start Date End Date Taking? Authorizing Provider  benzonatate (TESSALON) 100 MG capsule Take 1 capsule (100 mg total) by mouth 3 (three) times daily as needed for cough. Patient not taking: Reported on 10/26/2017 10/03/16   Antony Madura, PA-C  fluticasone [facility]) 50 MCG/ACT nasal spray Place 2 sprays into both nostrils daily. Patient not taking: Reported on  10/26/2017 10/03/16   Antony Madura, PA-C  ibuprofen (ADVIL,MOTRIN) 600 MG tablet Take 1 tablet (600 mg total) by mouth every 6 (six) hours as needed. Patient not taking: Reported on 10/26/2017 10/03/16   Antony Madura, PA-C  iron polysaccharides (NIFEREX) 150 MG capsule Take 1 capsule (150 mg total) by mouth daily. Patient not taking: Reported on 10/26/2017 04/11/16   Orvilla Cornwall A, CNM  medroxyPROGESTERone (DEPO-PROVERA) 150 MG/ML injection Inject 1 mL (150 mg total) into the muscle every 3 (three) months. Patient not taking: Reported on 04/16/2016 04/11/16   Roe Coombs, CNM  Prenat w/o A Vit-FeFum-FePo-FA (CONCEPT OB) 130-92.4-1 MG CAPS Take 1 tablet by mouth daily. Patient not taking: Reported on 10/26/2017 09/19/15   Dorathy Kinsman, CNM    Family History Family History  Problem Relation Age of Onset  . Hypertension Mother     Social History Social History   Tobacco Use  . Smoking status: Never Smoker  . Smokeless tobacco: Never Used  Substance Use Topics  . Alcohol use: No  . Drug use: No     Allergies   Patient has no known allergies.   Review of Systems Review of Systems  Constitutional: Negative for fever.  Gastrointestinal: Positive for abdominal pain.  All other systems reviewed and are negative.    Physical Exam Updated Vital Signs BP 138/88 (BP Location:  Right Arm)   Pulse 61   Temp 98.2 F (36.8 C) (Oral)   Resp 18   LMP 10/24/2017 (Exact Date)   SpO2 100%   Physical Exam  Constitutional: She appears well-developed and well-nourished. No distress.  HENT:  Head: Normocephalic and atraumatic.  Eyes: Conjunctivae are normal.  Neck: Neck supple.  Cardiovascular: Normal rate, regular rhythm and normal pulses.  No murmur heard. Pulmonary/Chest: Effort normal and breath sounds normal. No respiratory distress.  Abdominal: Soft. There is no tenderness.  Tender right upper quadrant.  Musculoskeletal: Normal range of motion. She exhibits no edema.    Neurological: She is alert.  Skin: Skin is warm and dry.  Psychiatric: She has a normal mood and affect.  Nursing note and vitals reviewed.    ED Treatments / Results  Labs (all labs ordered are listed, but only abnormal results are displayed) Labs Reviewed  CBC WITH DIFFERENTIAL/PLATELET - Abnormal; Notable for the following components:      Result Value   Hemoglobin 11.2 (*)    HCT 33.7 (*)    All other components within normal limits  COMPREHENSIVE METABOLIC PANEL - Abnormal; Notable for the following components:   Glucose, Bld 123 (*)    All other components within normal limits  LIPASE, BLOOD  PREGNANCY, URINE    EKG  EKG Interpretation  Date/Time:  Sunday October 26 2017 06:21:12 EST Ventricular Rate:  59 PR Interval:    QRS Duration: 80 QT Interval:  410 QTC Calculation: 407 R Axis:   48 Text Interpretation:  Sinus rhythm Normal ECG No previous ECGs available Confirmed by Paula LibraMolpus, John ([id number]) on 10/26/2017 6:27:56 AM Also confirmed by Paula LibraMolpus, John ([id number]), editor Barbette Hairassel, Kerry [phone])  on 10/26/2017 7:59:05 AM       Radiology Dg Chest 2 View  Result Date: 10/26/2017 CLINICAL DATA:  Anterior chest pain/pressure and burning. EXAM: CHEST  2 VIEW COMPARISON:  10/02/2016 FINDINGS: The cardiomediastinal silhouette is within normal limits. The lungs are well inflated and clear. There is no evidence of pleural effusion or pneumothorax. No acute osseous abnormality is identified. IMPRESSION: No active cardiopulmonary disease. Electronically Signed   By: Sebastian AcheAllen  Grady M.D.   On: 10/26/2017 08:18   [place] Abdomen Limited Ruq  Result Date: 10/26/2017 CLINICAL DATA:  Right upper quadrant abdominal pain. EXAM: ULTRASOUND ABDOMEN LIMITED RIGHT UPPER QUADRANT COMPARISON:  None. FINDINGS: Gallbladder: Multiple small gallstones in the gallbladder, measuring up to 6 mm in maximum diameter each. No gallbladder wall thickening or pericholecystic fluid. No sonographic Murphy sign. Common bile  duct: Diameter: 2.0 mm Liver: No focal lesion identified. Within normal limits in parenchymal echogenicity. Portal vein is patent on color Doppler imaging with normal direction of blood flow towards the liver. IMPRESSION: Cholelithiasis without evidence of cholecystitis. Electronically Signed   By: Beckie SaltsSteven  Reid M.D.   On: 10/26/2017 11:32    Procedures Procedures (including critical care time)  Medications Ordered in ED Medications  gi cocktail (Maalox,Lidocaine,Donnatal) (30 mLs Oral Given 10/26/17 0746)  oxyCODONE-acetaminophen (PERCOCET/ROXICET) 5-325 MG per tablet 2 tablet (2 tablets Oral Given 10/26/17 1119)     Initial Impression / Assessment and Plan / ED Course  I have reviewed the triage vital signs and the nursing notes.  Pertinent labs & imaging results that were available during my care of the patient were reviewed by me and considered in my medical decision making (see chart for details).    MDM Pt reports some relief with medications. Labs reviewed.  RUQ  Ultrasound  ordered to evaluate for gallstones.  Ultrasound shows gallstone,  No acute cholecystitis.  Pt counseled on ultrasound results.  Pt advised to see [facility] for evaluation    Final Clinical Impressions(s) / ED Diagnoses   Final diagnoses:  RUQ pain    ED Discharge Orders    None    An After Visit Summary was printed and given to the patient.    Elson Areas, PA-C 10/26/17 1610    [address], [place] 10/26/17 1611    Paula Libra, MD 11/03/17 [phone]

## 2017-10-26 NOTE — ED Triage Notes (Signed)
Pt states she woke up out of her sleep with pain in her central chest  Pt states she drank water and some ginger ale but it did not help her pain  Pt describes as a throbbing type pain and that she feels a little short of breath

## 2017-10-26 NOTE — Discharge Instructions (Signed)
Schedule to see the General surgeon for evaluation .  Return if any problems.

## 2017-11-24 ENCOUNTER — Ambulatory Visit: Payer: Self-pay | Admitting: Surgery

## 2017-11-24 DIAGNOSIS — K8010 Calculus of gallbladder with chronic cholecystitis without obstruction: Secondary | ICD-10-CM | POA: Diagnosis not present

## 2017-11-24 NOTE — H&P (Signed)
History of Present Illness Rachel Aguirre(Rachel Aguirre K. Rachel Tennison Rachel Aguirre; 11/24/2017 9:05 AM) The patient is a 22 year old female who presents for evaluation of gall stones. Referred by Dr. Brock BadLane Molpus for cholecystitis  This is a 22 year old female good health who presents with a one-month history of postprandial right upper quadrant abdominal pain associated with nausea and vomiting. This initially started after eating [employer]. The pain has become more severe. She is having difficulty eating any type of food at this point. The pain is intermittent but seems to be much more frequent much more severe. She presented to the emergency department on 10/26/17. Ultrasound showed multiple gallstones but no sign of cholecystitis. Liver function tests and white count were normal. She was then referred for surgical evaluation.  CLINICAL DATA: Right upper quadrant abdominal pain.  EXAM: ULTRASOUND ABDOMEN LIMITED RIGHT UPPER QUADRANT  COMPARISON: None.  FINDINGS: Gallbladder:  Multiple small gallstones in the gallbladder, measuring up to 6 mm in maximum diameter each. No gallbladder wall thickening or pericholecystic fluid. No sonographic Murphy sign.  Common bile duct:  Diameter: 2.0 mm  Liver:  No focal lesion identified. Within normal limits in parenchymal echogenicity. Portal vein is patent on color Doppler imaging with normal direction of blood flow towards the liver.  IMPRESSION: Cholelithiasis without evidence of cholecystitis.   Electronically Signed By: Rachel Aguirre M.D. On: 10/26/2017 11:32    Allergies (Rachel Aguirre, RMA; 11/24/2017 8:49 AM) No Known Drug Allergies [11/24/2017]: Allergies Reconciled  Medication History (Rachel Aguirre, RMA; 11/24/2017 8:49 AM) No Current Medications Medications Reconciled    Vitals (Rachel A. Brown RMA; 11/24/2017 8:49 AM) 11/24/2017 8:48 AM Weight: 175.2 lb Height: 64in Body Surface Area: 1.85 m Body Mass Index: 30.07 kg/m  Temp.:  97.20F  Pulse: 72 (Regular)  BP: 136/82 (Sitting, Left Arm, Standard)      Physical Exam Rachel Aguirre(Markeeta Scalf K. Annaliah Rivenbark Rachel Aguirre; 11/24/2017 9:06 AM)  The physical exam findings are as follows: Note:WDWN in NAD Eyes: Pupils equal, round; sclera anicteric HENT: Oral mucosa moist; good dentition Neck: No masses palpated, no thyromegaly Lungs: CTA bilaterally; normal respiratory effort CV: Regular rate and rhythm; no murmurs; extremities well-perfused with no edema Abd: +bowel sounds, soft, tender in RUQ and epigastrium, no palpable organomegaly; no palpable hernias; piercing scars at umbilicus Skin: Warm, dry; no sign of jaundice Psychiatric - alert and oriented x 4; calm mood and affect    Assessment & Plan Rachel Aguirre(Rachel Felber K. Veena Sturgess Rachel Aguirre; 11/24/2017 9:08 AM)  CHRONIC CHOLECYSTITIS WITH CALCULUS (K80.10)  Current Plans Schedule for Surgery - Laparoscopic cholecystectomy with intraoperative cholangiogram. The surgical procedure has been discussed with the patient. Potential risks, benefits, alternative treatments, and expected outcomes have been explained. All of the patient's questions at this time have been answered. The likelihood of reaching the patient's treatment goal is good. The patient understand the proposed surgical procedure and wishes to proceed. Pt Education - Pamphlet Given - Laparoscopic Gallbladder Surgery: discussed with patient and provided information. Started OxyCODONE HCl 5 MG Oral Tablet, 1 (one) Tablet every six hours, as needed, #20, 11/24/2017, No Refill.   Rachel ArmsMatthew K. Corliss Skainssuei, Rachel Aguirre, Rachel Aguirre - [place] [facility]  General/ Trauma Surgery  11/24/2017 9:08 AM

## 2017-12-09 ENCOUNTER — Inpatient Hospital Stay (HOSPITAL_COMMUNITY): Admission: RE | Admit: 2017-12-09 | Payer: Medicaid Other | Source: Ambulatory Visit

## 2017-12-11 ENCOUNTER — Ambulatory Visit: Admit: 2017-12-11 | Payer: Medicaid Other | Admitting: Surgery

## 2017-12-11 SURGERY — LAPAROSCOPIC CHOLECYSTECTOMY WITH INTRAOPERATIVE CHOLANGIOGRAM
Anesthesia: General

## 2017-12-29 NOTE — Pre-Procedure Instructions (Signed)
Rachel Aguirre  12/29/2017      [facility] [phone] [place] - [address] [facility] [address] [place] [id number] Phone: [phone] Fax: [phone]    Your procedure is scheduled on January 06, 2018.  [facility] at 7:00 AM.  Call this number if you have problems the morning of surgery:  [phone]   Remember:  Do not eat food or drink liquids after midnight.  Take these medicines the morning of surgery with A SIP OF WATER cetirizine (zyrtec)-if needed Famotidine (pepcid)-if needed Oxycodone-if needed for pain   Do not wear jewelry, make-up or nail polish.  Do not wear lotions, powders, or perfumes, or deodorant.  Do not shave 48 hours prior to surgery  Do not bring valuables to the hospital.  [facility] is not responsible for any belongings or valuables.  Contacts, dentures or bridgework may not be worn into surgery.  Leave your suitcase in the car.  After surgery it may be brought to your room.  For patients admitted to the hospital, discharge time will be determined by your treatment team.  Patients discharged the day of surgery will not be allowed to drive home.   [place]- Preparing For Surgery  Before surgery, you can play an important role. Because skin is not sterile, your skin needs to be as free of germs as possible. You can reduce the number of germs on your skin by washing with CHG (chlorahexidine gluconate) Soap before surgery.  CHG is an antiseptic cleaner which kills germs and bonds with the skin to continue killing germs even after washing.  Please do not use if you have an allergy to CHG or antibacterial soaps. If your skin becomes reddened/irritated stop using the CHG.  Do not shave (including legs and underarms) for at least 48 hours prior to first CHG shower. It is OK to shave your face.  Please follow these instructions carefully.   1. Shower the NIGHT  BEFORE SURGERY and the MORNING OF SURGERY with CHG.   2. If you chose to wash your hair, wash your hair first as usual with your normal shampoo.  3. After you shampoo, rinse your hair and body thoroughly to remove the shampoo.  4. Use CHG as you would any other liquid soap. You can apply CHG directly to the skin and wash gently with a scrungie or a clean washcloth.   5. Apply the CHG Soap to your body ONLY FROM THE NECK DOWN.  Do not use on open wounds or open sores. Avoid contact with your eyes, ears, mouth and genitals (private parts). Wash Face and genitals (private parts)  with your normal soap.  6. Wash thoroughly, paying special attention to the area where your surgery will be performed.  7. Thoroughly rinse your body with warm water from the neck down.  8. DO NOT shower/wash with your normal soap after using and rinsing off the CHG Soap.  9. Pat yourself dry with a CLEAN TOWEL.  10. Wear CLEAN PAJAMAS to bed the night before surgery, wear comfortable clothes the morning of surgery  11. Place CLEAN SHEETS on your bed the night of your first shower and DO NOT SLEEP WITH PETS.  Day of Surgery: Do not apply any deodorants/lotions. Please wear clean clothes to the hospital/surgery center.    Please read over the following fact sheets that you were given. Pain  Booklet, Coughing and Deep Breathing and Surgical Site Infection Prevention

## 2017-12-30 ENCOUNTER — Encounter (HOSPITAL_COMMUNITY)
Admission: RE | Admit: 2017-12-30 | Discharge: 2017-12-30 | Disposition: A | Discharge status: Home / Self Care | Payer: Medicaid Other | Source: Ambulatory Visit | Attending: Surgery | Admitting: Surgery

## 2017-12-30 ENCOUNTER — Encounter (HOSPITAL_COMMUNITY): Payer: Self-pay

## 2017-12-30 ENCOUNTER — Other Ambulatory Visit: Payer: Self-pay

## 2017-12-30 DIAGNOSIS — Z01812 Encounter for preprocedural laboratory examination: Secondary | ICD-10-CM | POA: Insufficient documentation

## 2017-12-30 HISTORY — DX: Other specified postprocedural states: Z98.890

## 2017-12-30 HISTORY — DX: Other specified postprocedural states: R11.2

## 2017-12-30 LAB — CBC
HEMATOCRIT: 36.3 % (ref 36.0–46.0)
Hemoglobin: 11.7 g/dL — ABNORMAL LOW (ref 12.0–15.0)
MCH: 27.1 pg (ref 26.0–34.0)
MCHC: 32.2 g/dL (ref 30.0–36.0)
MCV: 84 fL (ref 78.0–100.0)
Platelets: 206 10*3/uL (ref 150–400)
RBC: 4.32 MIL/uL (ref 3.87–5.11)
RDW: 13.9 % (ref 11.5–15.5)
WBC: 5.6 10*3/uL (ref 4.0–10.5)

## 2018-01-06 ENCOUNTER — Ambulatory Visit (HOSPITAL_COMMUNITY): Payer: Medicaid Other | Admitting: Anesthesiology

## 2018-01-06 ENCOUNTER — Encounter (HOSPITAL_COMMUNITY): Payer: Self-pay | Admitting: *Deleted

## 2018-01-06 ENCOUNTER — Ambulatory Visit (HOSPITAL_COMMUNITY): Payer: Medicaid Other

## 2018-01-06 ENCOUNTER — Ambulatory Visit (HOSPITAL_COMMUNITY)
Admission: RE | Admit: 2018-01-06 | Discharge: 2018-01-06 | Disposition: A | Discharge status: Home / Self Care | Payer: Medicaid Other | Source: Ambulatory Visit | Attending: Surgery | Admitting: Surgery

## 2018-01-06 ENCOUNTER — Encounter (HOSPITAL_COMMUNITY)
Admission: RE | Disposition: A | Discharge status: Home / Self Care | Payer: Self-pay | Source: Ambulatory Visit | Attending: Surgery

## 2018-01-06 DIAGNOSIS — Z419 Encounter for procedure for purposes other than remedying health state, unspecified: Secondary | ICD-10-CM

## 2018-01-06 DIAGNOSIS — K8010 Calculus of gallbladder with chronic cholecystitis without obstruction: Secondary | ICD-10-CM | POA: Diagnosis not present

## 2018-01-06 HISTORY — PX: CHOLECYSTECTOMY: SHX55

## 2018-01-06 LAB — POCT PREGNANCY, URINE: Preg Test, Ur: NEGATIVE

## 2018-01-06 SURGERY — LAPAROSCOPIC CHOLECYSTECTOMY WITH INTRAOPERATIVE CHOLANGIOGRAM
Anesthesia: General | Site: Abdomen

## 2018-01-06 MED ORDER — ONDANSETRON HCL 4 MG/2ML IJ SOLN
INTRAMUSCULAR | Status: DC | PRN
  Administered 2018-01-06: 4 mg via INTRAVENOUS

## 2018-01-06 MED ORDER — LIDOCAINE 2% (20 MG/ML) 5 ML SYRINGE
INTRAMUSCULAR | Status: DC | PRN
  Administered 2018-01-06 (×2): 50 mg via INTRAVENOUS

## 2018-01-06 MED ORDER — OXYCODONE HCL 5 MG PO TABS
5.0000 mg | ORAL_TABLET | Freq: Once | ORAL | Status: AC | PRN
  Administered 2018-01-06: 5 mg via ORAL

## 2018-01-06 MED ORDER — FENTANYL CITRATE (PF) 100 MCG/2ML IJ SOLN: 25.0000 ug | INTRAMUSCULAR | Status: DC | PRN

## 2018-01-06 MED ORDER — STERILE WATER FOR IRRIGATION IR SOLN
Status: DC | PRN
  Administered 2018-01-06: 1000 mL

## 2018-01-06 MED ORDER — 0.9 % SODIUM CHLORIDE (POUR BTL) OPTIME
TOPICAL | Status: DC | PRN
  Administered 2018-01-06: 1000 mL

## 2018-01-06 MED ORDER — ROCURONIUM BROMIDE 10 MG/ML (PF) SYRINGE
PREFILLED_SYRINGE | INTRAVENOUS | Status: DC | PRN
  Administered 2018-01-06: 50 mg via INTRAVENOUS

## 2018-01-06 MED ORDER — LACTATED RINGERS IV SOLN
INTRAVENOUS | Status: DC
  Administered 2018-01-06 (×2): via INTRAVENOUS

## 2018-01-06 MED ORDER — MIDAZOLAM HCL 2 MG/2ML IJ SOLN
INTRAMUSCULAR | Status: AC
  Filled 2018-01-06: qty 2

## 2018-01-06 MED ORDER — GLYCOPYRROLATE 0.2 MG/ML IV SOSY
PREFILLED_SYRINGE | INTRAVENOUS | Status: DC | PRN
  Administered 2018-01-06 (×2): .2 mg via INTRAVENOUS
  Administered 2018-01-06: 0.6 mg via INTRAVENOUS

## 2018-01-06 MED ORDER — FENTANYL CITRATE (PF) 250 MCG/5ML IJ SOLN
INTRAMUSCULAR | Status: AC
  Filled 2018-01-06: qty 5

## 2018-01-06 MED ORDER — DIPHENHYDRAMINE HCL 50 MG/ML IJ SOLN
INTRAMUSCULAR | Status: DC | PRN
  Administered 2018-01-06: 12.5 mg via INTRAVENOUS

## 2018-01-06 MED ORDER — CEFAZOLIN SODIUM-DEXTROSE 2-4 GM/100ML-% IV SOLN
2.0000 g | INTRAVENOUS | Status: AC
  Administered 2018-01-06: 2 g via INTRAVENOUS
  Filled 2018-01-06: qty 100

## 2018-01-06 MED ORDER — SUGAMMADEX SODIUM 200 MG/2ML IV SOLN
INTRAVENOUS | Status: DC | PRN
  Administered 2018-01-06: 200 mg via INTRAVENOUS

## 2018-01-06 MED ORDER — CHLORHEXIDINE GLUCONATE CLOTH 2 % EX PADS: 6.0000 | MEDICATED_PAD | Freq: Once | CUTANEOUS | Status: DC

## 2018-01-06 MED ORDER — NEOSTIGMINE METHYLSULFATE 5 MG/5ML IV SOSY
PREFILLED_SYRINGE | INTRAVENOUS | Status: DC | PRN
  Administered 2018-01-06: 4 mg via INTRAVENOUS

## 2018-01-06 MED ORDER — KETOROLAC TROMETHAMINE 30 MG/ML IJ SOLN
INTRAMUSCULAR | Status: DC | PRN
  Administered 2018-01-06: 30 mg via INTRAVENOUS

## 2018-01-06 MED ORDER — DEXAMETHASONE SODIUM PHOSPHATE 10 MG/ML IJ SOLN
INTRAMUSCULAR | Status: DC | PRN
  Administered 2018-01-06: 10 mg via INTRAVENOUS

## 2018-01-06 MED ORDER — ONDANSETRON HCL 4 MG/2ML IJ SOLN: 4.0000 mg | Freq: Once | INTRAMUSCULAR | Status: DC | PRN

## 2018-01-06 MED ORDER — FENTANYL CITRATE (PF) 250 MCG/5ML IJ SOLN
INTRAMUSCULAR | Status: DC | PRN
  Administered 2018-01-06 (×2): 50 ug via INTRAVENOUS
  Administered 2018-01-06: 100 ug via INTRAVENOUS

## 2018-01-06 MED ORDER — SODIUM CHLORIDE 0.9 % IV SOLN
INTRAVENOUS | Status: DC | PRN
  Administered 2018-01-06: 4 mL

## 2018-01-06 MED ORDER — IOPAMIDOL (ISOVUE-300) INJECTION 61%
INTRAVENOUS | Status: AC
  Filled 2018-01-06: qty 50

## 2018-01-06 MED ORDER — OXYCODONE HCL 5 MG/5ML PO SOLN: 5.0000 mg | Freq: Once | ORAL | Status: AC | PRN

## 2018-01-06 MED ORDER — MIDAZOLAM HCL 2 MG/2ML IJ SOLN
INTRAMUSCULAR | Status: DC | PRN
  Administered 2018-01-06: 2 mg via INTRAVENOUS

## 2018-01-06 MED ORDER — PROPOFOL 10 MG/ML IV BOLUS
INTRAVENOUS | Status: AC
  Filled 2018-01-06: qty 40

## 2018-01-06 MED ORDER — SODIUM CHLORIDE 0.9 % IR SOLN
Status: DC | PRN
  Administered 2018-01-06: 1000 mL

## 2018-01-06 MED ORDER — PROPOFOL 10 MG/ML IV BOLUS
INTRAVENOUS | Status: DC | PRN
  Administered 2018-01-06: 180 mg via INTRAVENOUS

## 2018-01-06 MED ORDER — BUPIVACAINE-EPINEPHRINE 0.25% -1:200000 IJ SOLN
INTRAMUSCULAR | Status: DC | PRN
  Administered 2018-01-06: 11 mL

## 2018-01-06 MED ORDER — BUPIVACAINE-EPINEPHRINE (PF) 0.25% -1:200000 IJ SOLN
INTRAMUSCULAR | Status: AC
  Filled 2018-01-06: qty 30

## 2018-01-06 MED ORDER — OXYCODONE HCL 5 MG PO TABS: 5.0000 mg | ORAL_TABLET | Freq: Four times a day (QID) | ORAL | 0 refills | Status: DC | PRN

## 2018-01-06 MED ORDER — OXYCODONE HCL 5 MG PO TABS
ORAL_TABLET | ORAL | Status: AC
  Filled 2018-01-06: qty 1

## 2018-01-06 SURGICAL SUPPLY — 45 items
APPLIER CLIP ROT 10 11.4 M/L (STAPLE) ×3
BENZOIN TINCTURE PRP APPL 2/3 (GAUZE/BANDAGES/DRESSINGS) ×3 IMPLANT
CANISTER SUCT 3000ML PPV (MISCELLANEOUS) ×3 IMPLANT
CHLORAPREP W/TINT 26ML (MISCELLANEOUS) ×3 IMPLANT
CLIP APPLIE ROT 10 11.4 M/L (STAPLE) ×1 IMPLANT
CLOSURE WOUND 1/2 X4 (GAUZE/BANDAGES/DRESSINGS) ×1
COVER MAYO STAND STRL (DRAPES) ×3 IMPLANT
COVER SURGICAL LIGHT HANDLE (MISCELLANEOUS) ×3 IMPLANT
DRAPE C-ARM 42X72 X-RAY (DRAPES) ×3 IMPLANT
DRSG TEGADERM 2-3/8X2-3/4 SM (GAUZE/BANDAGES/DRESSINGS) ×3 IMPLANT
DRSG TEGADERM 4X4.75 (GAUZE/BANDAGES/DRESSINGS) ×3 IMPLANT
ELECT REM PT RETURN 9FT ADLT (ELECTROSURGICAL) ×3
ELECTRODE REM PT RTRN 9FT ADLT (ELECTROSURGICAL) ×1 IMPLANT
FILTER SMOKE EVAC LAPAROSHD (FILTER) ×3 IMPLANT
GAUZE SPONGE 2X2 8PLY STRL LF (GAUZE/BANDAGES/DRESSINGS) ×1 IMPLANT
GLOVE BIO SURGEON STRL SZ7 (GLOVE) ×3 IMPLANT
GLOVE BIO SURGEON STRL SZ7.5 (GLOVE) ×3 IMPLANT
GLOVE BIOGEL PI IND STRL 6.5 (GLOVE) ×2 IMPLANT
GLOVE BIOGEL PI IND STRL 7.5 (GLOVE) ×2 IMPLANT
GLOVE BIOGEL PI INDICATOR 6.5 (GLOVE) ×4
GLOVE BIOGEL PI INDICATOR 7.5 (GLOVE) ×4
GLOVE ECLIPSE 6.0 STRL STRAW (GLOVE) ×3 IMPLANT
GLOVE SURG SS PI 6.5 STRL IVOR (GLOVE) ×3 IMPLANT
GOWN STRL REUS W/ TWL LRG LVL3 (GOWN DISPOSABLE) ×3 IMPLANT
GOWN STRL REUS W/TWL LRG LVL3 (GOWN DISPOSABLE) ×6
KIT BASIN OR (CUSTOM PROCEDURE TRAY) ×3 IMPLANT
KIT TURNOVER KIT B (KITS) ×3 IMPLANT
NS IRRIG 1000ML POUR BTL (IV SOLUTION) ×3 IMPLANT
PAD ARMBOARD 7.5X6 YLW CONV (MISCELLANEOUS) ×6 IMPLANT
POUCH SPECIMEN RETRIEVAL 10MM (ENDOMECHANICALS) ×3 IMPLANT
SCISSORS LAP 5X35 DISP (ENDOMECHANICALS) ×3 IMPLANT
SET CHOLANGIOGRAPH 5 50 .035 (SET/KITS/TRAYS/PACK) ×3 IMPLANT
SET IRRIG TUBING LAPAROSCOPIC (IRRIGATION / IRRIGATOR) ×3 IMPLANT
SLEEVE ENDOPATH XCEL 5M (ENDOMECHANICALS) ×3 IMPLANT
SPECIMEN JAR SMALL (MISCELLANEOUS) ×3 IMPLANT
SPONGE GAUZE 2X2 STER 10/PKG (GAUZE/BANDAGES/DRESSINGS) ×2
STRIP CLOSURE SKIN 1/2X4 (GAUZE/BANDAGES/DRESSINGS) ×2 IMPLANT
SUT MNCRL AB 4-0 PS2 18 (SUTURE) ×3 IMPLANT
TOWEL GREEN STERILE FF (TOWEL DISPOSABLE) ×3 IMPLANT
TRAY LAPAROSCOPIC MC (CUSTOM PROCEDURE TRAY) ×3 IMPLANT
TROCAR XCEL BLUNT TIP 100MML (ENDOMECHANICALS) ×3 IMPLANT
TROCAR XCEL NON-BLD 11X100MML (ENDOMECHANICALS) ×3 IMPLANT
TROCAR XCEL NON-BLD 5MMX100MML (ENDOMECHANICALS) ×3 IMPLANT
TUBING INSUFFLATION (TUBING) ×3 IMPLANT
WATER STERILE IRR 1000ML POUR (IV SOLUTION) ×3 IMPLANT

## 2018-01-06 NOTE — H&P (Signed)
History of Present Illness  The patient is a 22 year old female who presents for evaluation of gall stones. Referred by Dr. Brock BadLane Molpus for cholecystitis  This is a 22 year old female good health who presents with a one-month history of postprandial right upper quadrant abdominal pain associated with nausea and vomiting. This initially started after eating [employer]. The pain has become more severe. She is having difficulty eating any type of food at this point. The pain is intermittent but seems to be much more frequent much more severe. She presented to the emergency department on 10/26/17. Ultrasound showed multiple gallstones but no sign of cholecystitis. Liver function tests and white count were normal. She was then referred for surgical evaluation.  CLINICAL DATA: Right upper quadrant abdominal pain.  EXAM: ULTRASOUND ABDOMEN LIMITED RIGHT UPPER QUADRANT  COMPARISON: None.  FINDINGS: Gallbladder:  Multiple small gallstones in the gallbladder, measuring up to 6 mm in maximum diameter each. No gallbladder wall thickening or pericholecystic fluid. No sonographic Murphy sign.  Common bile duct:  Diameter: 2.0 mm  Liver:  No focal lesion identified. Within normal limits in parenchymal echogenicity. Portal vein is patent on color Doppler imaging with normal direction of blood flow towards the liver.  IMPRESSION: Cholelithiasis without evidence of cholecystitis.   Electronically Signed By: Beckie SaltsSteven Reid M.D. On: 10/26/2017 11:32    Allergies  No Known Drug Allergies  Allergies Reconciled  Medication History  No Current Medications Medications Reconciled    Vitals  Weight: 175.2 lb Height: 64in Body Surface Area: 1.85 m Body Mass Index: 30.07 kg/m  Temp.: 97.26F  Pulse: 72 (Regular)  BP: 136/82 (Sitting, Left Arm, Standard)      Physical Exam  The physical exam findings are as follows: Note:WDWN in  NAD Eyes: Pupils equal, round; sclera anicteric HENT: Oral mucosa moist; good dentition Neck: No masses palpated, no thyromegaly Lungs: CTA bilaterally; normal respiratory effort CV: Regular rate and rhythm; no murmurs; extremities well-perfused with no edema Abd: +bowel sounds, soft, tender in RUQ and epigastrium, no palpable organomegaly; no palpable hernias; piercing scars at umbilicus Skin: Warm, dry; no sign of jaundice Psychiatric - alert and oriented x 4; calm mood and affect    Assessment & Plan   CHRONIC CHOLECYSTITIS WITH CALCULUS (K80.10)  Current Plans Schedule for Surgery - Laparoscopic cholecystectomy with intraoperative cholangiogram. The surgical procedure has been discussed with the patient. Potential risks, benefits, alternative treatments, and expected outcomes have been explained. All of the patient's questions at this time have been answered. The likelihood of reaching the patient's treatment goal is good. The patient understand the proposed surgical procedure and wishes to proceed. Pt Education - Pamphlet Given - Laparoscopic Gallbladder Surgery: discussed with patient and provided information. Started OxyCODONE HCl 5 MG Oral Tablet, 1 (one) Tablet every six hours, as needed, #20, 11/24/2017, No Refill.    Wilmon ArmsMatthew K. Corliss Skainssuei, MD, [facility]  General/ Trauma Surgery  01/06/2018 7:31 AM

## 2018-01-06 NOTE — Discharge Instructions (Signed)
[facility] ______[facility] °LAPAROSCOPIC SURGERY: POST OP INSTRUCTIONS °Always review your discharge instruction sheet given to you by the facility where your surgery was performed. °IF YOU HAVE DISABILITY OR FAMILY LEAVE FORMS, YOU MUST BRING THEM TO THE OFFICE FOR PROCESSING.   °DO NOT GIVE THEM TO YOUR DOCTOR. ° °1. A prescription for pain medication may be given to you upon discharge.  Take your pain medication as prescribed, if needed.  If narcotic pain medicine is not needed, then you may take acetaminophen (Tylenol) or ibuprofen (Advil) as needed. °2. Take your usually prescribed medications unless otherwise directed. °3. If you need a refill on your pain medication, please contact your pharmacy.  They will contact our office to request authorization. Prescriptions will not be filled after 5pm or on week-ends. °4. You should follow a light diet the first few days after arrival home, such as soup and crackers, etc.  Be sure to include lots of fluids daily. °5. Most patients will experience some swelling and bruising in the area of the incisions.  Ice packs will help.  Swelling and bruising can take several days to resolve.  °6. It is common to experience some constipation if taking pain medication after surgery.  Increasing fluid intake and taking a stool softener (such as Colace) will usually help or prevent this problem from occurring.  A mild laxative (Milk of Magnesia or Miralax) should be taken according to package instructions if there are no bowel movements after 48 hours. °7. Unless discharge instructions indicate otherwise, you may remove your bandages 24-48 hours after surgery, and you may shower at that time.  You may have steri-strips (small skin tapes) in place directly over the incision.  These strips should be left on the skin for 7-10 days.  If your surgeon used skin glue on the incision, you may shower  in 24 hours.  The glue will flake off over the next 2-3 weeks.  Any sutures or staples will be removed at the office during your follow-up visit. °8. ACTIVITIES:  You may resume regular (light) daily activities beginning the next day--such as daily self-care, walking, climbing stairs--gradually increasing activities as tolerated.  You may have sexual intercourse when it is comfortable.  Refrain from any heavy lifting or straining until approved by your doctor. °a. You may drive when you are no longer taking prescription pain medication, you can comfortably wear a seatbelt, and you can safely maneuver your car and apply brakes. °b. RETURN TO WORK:  __________________________________________________________ °9. You should see your doctor in the office for a follow-up appointment approximately 2-3 weeks after your surgery.  Make sure that you call for this appointment within a day or two after you arrive home to insure a convenient appointment time. °10. OTHER INSTRUCTIONS: __________________________________________________________________________________________________________________________ __________________________________________________________________________________________________________________________ °WHEN TO CALL YOUR DOCTOR: °1. Fever over 101.0 °2. Inability to urinate °3. Continued bleeding from incision. °4. Increased pain, redness, or drainage from the incision. °5. Increasing abdominal pain ° °The clinic staff is available to answer your questions during regular business hours.  Please don’t hesitate to call and ask to speak to one of the nurses for clinical concerns.  If you have a medical emergency, go to the nearest emergency room or call 911.  A surgeon from [facility] is always on call at the hospital. °[address], [address], [place], [place]  [address] ? [address] [address], [place], [place]   [address] °[phone] ? [phone] ? FAX [phone] °Web site:    [email or web address] ° ° °Post Anesthesia Home Care Instructions ° °Activity: °Get plenty of rest for the remainder of the day. A responsible individual must stay with you for 24 hours following the procedure.  °For the next 24 hours, DO NOT: °-Drive a car °-Operate machinery °-Drink alcoholic beverages °-Take any medication unless instructed by your physician °-Make any legal decisions or sign important papers. ° °Meals: °Start with liquid foods such as gelatin or soup. Progress to regular foods as tolerated. Avoid greasy, spicy, heavy foods. If nausea and/or vomiting occur, drink only clear liquids until the nausea and/or vomiting subsides. Call your physician if vomiting continues. ° °Special Instructions/Symptoms: °Your throat may feel dry or sore from the anesthesia or the breathing tube placed in your throat during surgery. If this causes discomfort, gargle with warm salt water. The discomfort should disappear within 24 hours. ° °If you had a scopolamine patch placed behind your ear for the management of post- operative nausea and/or vomiting: ° °1. The medication in the patch is effective for 72 hours, after which it should be removed.  Wrap patch in a tissue and discard in the trash. Wash hands thoroughly with soap and water. °2. You may remove the patch earlier than 72 hours if you experience unpleasant side effects which may include dry mouth, dizziness or visual disturbances. °3. Avoid touching the patch. Wash your hands with soap and water after contact with the patch. °   ° °

## 2018-01-06 NOTE — Transfer of Care (Signed)
Immediate Anesthesia Transfer of Care Note  Patient: Rachel Aguirre  Procedure(s) Performed: LAPAROSCOPIC CHOLECYSTECTOMY WITH INTRAOPERATIVE CHOLANGIOGRAM (N/A Abdomen)  Patient Location: PACU  Anesthesia Type:General  Level of Consciousness: drowsy  Airway & Oxygen Therapy: Patient Spontanous Breathing and Patient connected to nasal cannula oxygen  Post-op Assessment: Report given to RN and Post -op Vital signs reviewed and stable  Post vital signs: Reviewed and stable  Last Vitals:  Vitals Value Taken Time  BP 115/76 01/06/2018 10:31 AM  Temp 36.4 C 01/06/2018 10:30 AM  Pulse 62 01/06/2018 10:30 AM  Resp 18 01/06/2018 10:43 AM  SpO2 100 % 01/06/2018 10:30 AM  Vitals shown include unvalidated device data.  Last Pain:  Vitals:   01/06/18 1030  TempSrc:   PainSc: Asleep         Complications: No apparent anesthesia complications

## 2018-01-06 NOTE — Op Note (Signed)
Laparoscopic Cholecystectomy with IOC Procedure Note  Indications: This patient presents with symptomatic gallbladder disease and will undergo laparoscopic cholecystectomy.  Pre-operative Diagnosis: Calculus of gallbladder with other cholecystitis, without mention of obstruction  Post-operative Diagnosis: Same  Surgeon: Wynona LunaMatthew K Ayzia Day   Assistants: none  Anesthesia: General endotracheal anesthesia  ASA Class: 1  Procedure Details  The patient was seen again in the Holding Room. The risks, benefits, complications, treatment options, and expected outcomes were discussed with the patient. The possibilities of reaction to medication, pulmonary aspiration, perforation of viscus, bleeding, recurrent infection, finding a normal gallbladder, the need for additional procedures, failure to diagnose a condition, the possible need to convert to an open procedure, and creating a complication requiring transfusion or operation were discussed with the patient. The likelihood of improving the patient's symptoms with return to their baseline status is good.  The patient and/or family concurred with the proposed plan, giving informed consent. The site of surgery properly noted. The patient was taken to Operating Room, [facility] and the procedure verified as Laparoscopic Cholecystectomy with Intraoperative Cholangiogram. A Time Out was held and the above information confirmed.  Prior to the induction of general anesthesia, antibiotic prophylaxis was administered. General endotracheal anesthesia was then administered and tolerated well. After the induction, the abdomen was prepped with Chloraprep and draped in the sterile fashion. The patient was positioned in the supine position.  Local anesthetic agent was injected into the skin below the umbilicus and an incision made. We dissected down to the abdominal fascia with blunt dissection.  The fascia was incised vertically and we entered the  peritoneal cavity bluntly.  A pursestring suture of 0-Vicryl was placed around the fascial opening.  The Hasson cannula was inserted and secured with the stay suture.  Pneumoperitoneum was then created with CO2 and tolerated well without any adverse changes in the patient's vital signs. An 11-mm port was placed in the subxiphoid position.  Two 5-mm ports were placed in the right upper quadrant. All skin incisions were infiltrated with a local anesthetic agent before making the incision and placing the trocars.   We positioned the patient in reverse Trendelenburg, tilted slightly to the patient's left.  The gallbladder was identified, the fundus grasped and retracted cephalad.  There were some omental adhesions to the gallbladder.  Adhesions were lysed bluntly and with the electrocautery where indicated, taking care not to injure any adjacent organs or viscus. The infundibulum was grasped and retracted laterally, exposing the peritoneum overlying the triangle of Calot. This was then divided and exposed in a blunt fashion. A critical view of the cystic duct and cystic artery was obtained.  The cystic duct was clearly identified and bluntly dissected circumferentially. The cystic duct was ligated with a clip distally.   An incision was made in the cystic duct and the [facility] catheter introduced. The catheter was secured using a clip. A cholangiogram was then obtained which showed good visualization of the distal and proximal biliary tree with no sign of filling defects or obstruction.  Contrast flowed easily into the duodenum. The catheter was then removed.   The cystic duct was then ligated with clips and divided. The cystic artery was identified, dissected free, ligated with clips and divided as well.   The gallbladder was dissected from the liver bed in retrograde fashion with the electrocautery. The gallbladder was removed and placed in an Endocatch sac. The liver bed was irrigated and inspected.  Hemostasis was achieved with the  electrocautery. Copious irrigation was utilized and was repeatedly aspirated until clear.  The gallbladder and Endocatch sac were then removed through the umbilical port site.  The pursestring suture was used to close the umbilical fascia.    We again inspected the right upper quadrant for hemostasis.  Pneumoperitoneum was released as we removed the trocars.  4-0 Monocryl was used to close the skin.   Benzoin, steri-strips, and clean dressings were applied. The patient was then extubated and brought to the recovery room in stable condition. Instrument, sponge, and needle counts were correct at closure and at the conclusion of the case.   Findings: Cholecystitis with Cholelithiasis  Estimated Blood Loss: Minimal         Drains: none         Specimens: Gallbladder           Complications: None; patient tolerated the procedure well.         Disposition: PACU - hemodynamically stable.         Condition: stable  Wilmon Arms. Corliss Skains, MD, [facility]  General/ Trauma Surgery  01/06/2018 10:09 AM

## 2018-01-06 NOTE — Anesthesia Procedure Notes (Signed)
Procedure Name: Intubation Date/Time: 01/06/2018 9:10 AM Performed by: Valda Favia, CRNA Pre-anesthesia Checklist: Patient identified, Emergency Drugs available, Suction available, Patient being monitored and Timeout performed Patient Re-evaluated:Patient Re-evaluated prior to induction Oxygen Delivery Method: Circle system utilized Preoxygenation: Pre-oxygenation with 100% oxygen Induction Type: IV induction Ventilation: Mask ventilation without difficulty Laryngoscope Size: Mac and 4 Grade View: Grade I Tube type: Oral Tube size: 7.0 mm Number of attempts: 1 Airway Equipment and Method: Stylet Placement Confirmation: ETT inserted through vocal cords under direct vision,  positive ETCO2 and breath sounds checked- equal and bilateral Secured at: 21 cm Tube secured with: Tape Dental Injury: Teeth and Oropharynx as per pre-operative assessment

## 2018-01-06 NOTE — Anesthesia Preprocedure Evaluation (Signed)
Anesthesia Evaluation  Patient identified by MRN, date of birth, ID band Patient awake    Reviewed: Allergy & Precautions, NPO status , Patient's Chart, lab work & pertinent test results  Airway Mallampati: II  TM Distance: >3 FB Neck ROM: Full    Dental  (+) Teeth Intact, Dental Advisory Given   Pulmonary    breath sounds clear to auscultation       Cardiovascular  Rhythm:Regular Rate:Normal     Neuro/Psych    GI/Hepatic   Endo/Other    Renal/GU      Musculoskeletal   Abdominal   Peds  Hematology   Anesthesia Other Findings   Reproductive/Obstetrics                             Anesthesia Physical Anesthesia Plan  ASA: II  Anesthesia Plan: General   Post-op Pain Management:    Induction: Intravenous  PONV Risk Score and Plan: 1 and Ondansetron and Dexamethasone  Airway Management Planned: Oral ETT  Additional Equipment:   Intra-op Plan:   Post-operative Plan: Extubation in OR  Informed Consent: I have reviewed the patients History and Physical, chart, labs and discussed the procedure including the risks, benefits and alternatives for the proposed anesthesia with the patient or authorized representative who has indicated his/her understanding and acceptance.     Dental advisory given  Plan Discussed with: CRNA and Anesthesiologist  Anesthesia Plan Comments:         Anesthesia Quick Evaluation  

## 2018-01-06 NOTE — Anesthesia Postprocedure Evaluation (Signed)
Anesthesia Post Note  Patient: Rachel Aguirre  Procedure(s) Performed: LAPAROSCOPIC CHOLECYSTECTOMY WITH INTRAOPERATIVE CHOLANGIOGRAM (N/A Abdomen)     Patient location during evaluation: PACU Anesthesia Type: General Level of consciousness: awake and alert Pain management: pain level controlled Vital Signs Assessment: post-procedure vital signs reviewed and stable Respiratory status: spontaneous breathing, nonlabored ventilation, respiratory function stable and patient connected to nasal cannula oxygen Cardiovascular status: blood pressure returned to baseline and stable Postop Assessment: no apparent nausea or vomiting Anesthetic complications: no    Last Vitals:  Vitals:   01/06/18 1114 01/06/18 1153  BP: 110/75 125/83  Pulse: (!) 57 (!) 58  Resp: 16 18  Temp: (!) 36.4 C   SpO2: 100% 100%    Last Pain:  Vitals:   01/06/18 1217  TempSrc:   PainSc: 2                  Lilian Fuhs COKER

## 2018-01-07 ENCOUNTER — Encounter (HOSPITAL_COMMUNITY): Payer: Self-pay | Admitting: Surgery

## 2018-02-11 ENCOUNTER — Other Ambulatory Visit: Payer: Self-pay

## 2018-02-11 ENCOUNTER — Encounter (HOSPITAL_COMMUNITY): Payer: Self-pay | Admitting: *Deleted

## 2018-02-11 ENCOUNTER — Inpatient Hospital Stay (HOSPITAL_COMMUNITY)
Admission: AD | Admit: 2018-02-11 | Discharge: 2018-02-11 | Disposition: A | Discharge status: Home / Self Care | Payer: Medicaid Other | Source: Ambulatory Visit | Attending: Obstetrics and Gynecology | Admitting: Obstetrics and Gynecology

## 2018-02-11 DIAGNOSIS — N912 Amenorrhea, unspecified: Secondary | ICD-10-CM | POA: Insufficient documentation

## 2018-02-11 DIAGNOSIS — Z331 Pregnant state, incidental: Secondary | ICD-10-CM

## 2018-02-11 LAB — POCT PREGNANCY, URINE: PREG TEST UR: POSITIVE — AB

## 2018-02-11 LAB — URINALYSIS, ROUTINE W REFLEX MICROSCOPIC
BILIRUBIN URINE: NEGATIVE
GLUCOSE, UA: NEGATIVE mg/dL
Hgb urine dipstick: NEGATIVE
Ketones, ur: NEGATIVE mg/dL
Leukocytes, UA: NEGATIVE
Nitrite: NEGATIVE
PH: 6 (ref 5.0–8.0)
Protein, ur: NEGATIVE mg/dL
SPECIFIC GRAVITY, URINE: 1.027 (ref 1.005–1.030)

## 2018-02-11 NOTE — Discharge Instructions (Signed)
[place]  ° ° °[facility]       Phone: [phone] ° °[facility] Phone: [phone] ° °[facility]  Phone: [phone] ° °[facility]  Phone: [phone] ° °[facility]  Phone: [phone] ° °[facility]       Phone: [phone] ° °[facility]    Phone: [phone]  ° °[facility]    Phone: [phone] ° °[facility]    Phone: [phone] ° °[facility]    Phone: [phone] ° °[facility]    Phone: [phone] ° °[facility]       Phone: [phone]  ° °[facility]  Phone: [phone] ° °[facility]    Phone: [phone] ° °[facility]   Phone: [phone] ° °[facility]      Phone: [phone] ° °[facility]    Phone: [phone] ° °Safe Medications in Pregnancy  ° °Acne: °Benzoyl Peroxide °Salicylic Acid ° °Backache/Headache: °Tylenol: 2 regular strength every 4 hours OR °             2 Extra strength every 6 hours ° °Colds/Coughs/Allergies: °Benadryl (alcohol free) 25 mg every 6 hours as needed °Breath right strips °Claritin °Cepacol throat lozenges °Chloraseptic throat spray °Cold-Eeze- up to three times per day °Cough drops, alcohol free °Flonase (by prescription only) °Guaifenesin °Mucinex °Robitussin DM (plain only, alcohol free) °Saline nasal spray/drops °Sudafed (pseudoephedrine) & Actifed ** use only after [redacted] weeks gestation and if you do not have high blood pressure °Tylenol °Vicks Vaporub °Zinc lozenges °Zyrtec  ° °Constipation: °Colace °Ducolax suppositories °Fleet enema °Glycerin suppositories °Metamucil °Milk of  magnesia °Miralax °Senokot °Smooth move tea ° °Diarrhea: °Kaopectate °Imodium A-D ° °*NO pepto Bismol ° °Hemorrhoids: °Anusol °Anusol HC °Preparation H °Tucks ° °Indigestion: °Tums °Maalox °Mylanta °Zantac  °Pepcid ° °Insomnia: °Benadryl (alcohol free) 25mg every 6 hours as needed °Tylenol PM °Unisom, no Gelcaps ° °Leg Cramps: °Tums °MagGel ° °Nausea/Vomiting:  °Bonine °Dramamine °Emetrol °Ginger extract °Sea bands °Meclizine  °Nausea medication to take during pregnancy:  °Unisom (doxylamine succinate 25 mg tablets) Take one tablet daily at bedtime. If symptoms are not adequately controlled, the dose can be increased to a maximum recommended dose of two tablets daily (1/2 tablet in the morning, 1/2 tablet mid-afternoon and one at bedtime). °Vitamin B6 100mg tablets. Take one tablet twice a day (up to 200 mg per day). ° °Skin Rashes: °Aveeno products °Benadryl cream or 25mg every 6 hours as needed °Calamine Lotion °1% cortisone cream ° °Yeast infection: °Gyne-lotrimin 7 °Monistat 7 ° ° °**If taking multiple medications, please check labels to avoid duplicating the same active ingredients °**take medication as directed on the label °** Do not exceed 4000 mg of tylenol in 24 hours °**Do not take medications that contain aspirin or ibuprofen ° ° ° ° °

## 2018-02-11 NOTE — MAU Note (Signed)
Came on period last month.  This month came on early, and was shorter. Has not done a home test.  Concerned might be preg, or something is wrong. No real problems, just concerned.

## 2018-02-11 NOTE — MAU Provider Note (Signed)
Rachel Aguirre is a 22 y.o. G2P2002 at 5.[redacted] weeks gestation by LMP of 01/07/2018 x 5 days (which is normal)who presents to MAU today for pregnancy verification. The patient denies abdominal pain or vaginal bleeding today. She does report that she had "some bleeding this month on 5/6 for 2 days. She is concerned that she might be pregnant or that "something else is wrong". She states that if she is pregnant she is "not sure if she will keep it".  BP 112/69 (BP Location: Right Arm)   Pulse 73   Temp 98.7 F (37.1 C) (Oral)   Resp 16   Wt 169 lb 12 oz (77 kg)   LMP 01/26/2018   SpO2 100%   BMI 29.14 kg/m    CONSTITUTIONAL: Well-developed, well-nourished female in no acute distress.  CARDIOVASCULAR: Regular heart rate RESPIRATORY: Normal effort NEUROLOGICAL: Alert and oriented to person, place, and time.  SKIN: Skin is warm and dry. No rash noted. Not diaphoretic. No erythema. No pallor. PSYCH: Normal mood and affect. Normal behavior. Normal judgment and thought content.  MDM Medical Screen Exam Complete Advised that the VB x 2 days this month was more than likely from implantation bleeding and this is a normal occurrence in pregnancy.  A: Amenorrhea   P: Discharge from MAU Patient may return to MAU as needed or if her condition were to change or worsen  Pregnancy verification letter given List of [place] [facility] providers given Safe Medications in Pregnancy list given  Raelyn Mora, CNM  02/11/2018 3:36 PM

## 2018-04-27 ENCOUNTER — Inpatient Hospital Stay (EMERGENCY_DEPARTMENT_HOSPITAL)
Admission: AD | Admit: 2018-04-27 | Discharge: 2018-04-27 | Disposition: A | Discharge status: Home / Self Care | Payer: Medicaid Other | Source: Ambulatory Visit | Attending: Obstetrics and Gynecology | Admitting: Obstetrics and Gynecology

## 2018-04-27 ENCOUNTER — Encounter (HOSPITAL_COMMUNITY): Payer: Self-pay | Admitting: *Deleted

## 2018-04-27 ENCOUNTER — Inpatient Hospital Stay (HOSPITAL_COMMUNITY): Payer: Medicaid Other

## 2018-04-27 ENCOUNTER — Inpatient Hospital Stay (HOSPITAL_COMMUNITY)
Admission: AD | Admit: 2018-04-27 | Discharge: 2018-04-27 | Discharge status: Against Medical Advice | Payer: Medicaid Other | Source: Ambulatory Visit | Attending: Obstetrics & Gynecology | Admitting: Obstetrics & Gynecology

## 2018-04-27 DIAGNOSIS — O418X10 Other specified disorders of amniotic fluid and membranes, first trimester, not applicable or unspecified: Secondary | ICD-10-CM | POA: Diagnosis present

## 2018-04-27 DIAGNOSIS — N760 Acute vaginitis: Secondary | ICD-10-CM

## 2018-04-27 DIAGNOSIS — O3680X0 Pregnancy with inconclusive fetal viability, not applicable or unspecified: Secondary | ICD-10-CM | POA: Diagnosis present

## 2018-04-27 DIAGNOSIS — R109 Unspecified abdominal pain: Secondary | ICD-10-CM

## 2018-04-27 DIAGNOSIS — O468X1 Other antepartum hemorrhage, first trimester: Secondary | ICD-10-CM | POA: Diagnosis not present

## 2018-04-27 DIAGNOSIS — R1030 Lower abdominal pain, unspecified: Secondary | ICD-10-CM | POA: Diagnosis present

## 2018-04-27 DIAGNOSIS — O208 Other hemorrhage in early pregnancy: Secondary | ICD-10-CM | POA: Insufficient documentation

## 2018-04-27 DIAGNOSIS — O26892 Other specified pregnancy related conditions, second trimester: Secondary | ICD-10-CM | POA: Diagnosis present

## 2018-04-27 DIAGNOSIS — B9689 Other specified bacterial agents as the cause of diseases classified elsewhere: Secondary | ICD-10-CM | POA: Diagnosis present

## 2018-04-27 DIAGNOSIS — Z3A15 15 weeks gestation of pregnancy: Secondary | ICD-10-CM | POA: Insufficient documentation

## 2018-04-27 DIAGNOSIS — O34219 Maternal care for unspecified type scar from previous cesarean delivery: Secondary | ICD-10-CM | POA: Diagnosis present

## 2018-04-27 DIAGNOSIS — Z3A09 9 weeks gestation of pregnancy: Secondary | ICD-10-CM | POA: Diagnosis not present

## 2018-04-27 DIAGNOSIS — O23592 Infection of other part of genital tract in pregnancy, second trimester: Secondary | ICD-10-CM | POA: Diagnosis not present

## 2018-04-27 DIAGNOSIS — Z3490 Encounter for supervision of normal pregnancy, unspecified, unspecified trimester: Secondary | ICD-10-CM

## 2018-04-27 DIAGNOSIS — O26899 Other specified pregnancy related conditions, unspecified trimester: Secondary | ICD-10-CM

## 2018-04-27 LAB — COMPREHENSIVE METABOLIC PANEL
ALBUMIN: 3.9 g/dL (ref 3.5–5.0)
ALK PHOS: 63 U/L (ref 38–126)
ALT: 19 U/L (ref 0–44)
AST: 21 U/L (ref 15–41)
Anion gap: 9 (ref 5–15)
BILIRUBIN TOTAL: 0.4 mg/dL (ref 0.3–1.2)
BUN: 9 mg/dL (ref 6–20)
CO2: 23 mmol/L (ref 22–32)
CREATININE: 0.64 mg/dL (ref 0.44–1.00)
Calcium: 9 mg/dL (ref 8.9–10.3)
Chloride: 103 mmol/L (ref 98–111)
GFR calc Af Amer: >60 mL/min (ref >60)
GLUCOSE: 98 mg/dL (ref 70–99)
POTASSIUM: 3.7 mmol/L (ref 3.5–5.1)
Sodium: 135 mmol/L (ref 135–145)
TOTAL PROTEIN: 7.4 g/dL (ref 6.5–8.1)

## 2018-04-27 LAB — WET PREP, GENITAL
SPERM: NONE SEEN
TRICH WET PREP: NONE SEEN
Yeast Wet Prep HPF POC: NONE SEEN

## 2018-04-27 LAB — CBC
HCT: 33 % — ABNORMAL LOW (ref 36.0–46.0)
HEMOGLOBIN: 11.2 g/dL — AB (ref 12.0–15.0)
MCH: 28.6 pg (ref 26.0–34.0)
MCHC: 33.9 g/dL (ref 30.0–36.0)
MCV: 84.2 fL (ref 78.0–100.0)
Platelets: 248 10*3/uL (ref 150–400)
RBC: 3.92 MIL/uL (ref 3.87–5.11)
RDW: 14.4 % (ref 11.5–15.5)
WBC: 6.3 10*3/uL (ref 4.0–10.5)

## 2018-04-27 MED ORDER — METRONIDAZOLE 500 MG PO TABS: 500.0000 mg | ORAL_TABLET | Freq: Two times a day (BID) | ORAL | 0 refills | Status: AC

## 2018-04-27 NOTE — MAU Note (Addendum)
Pt reports she is pregnant and she is having a "little" stomach pain. Pain started yesterday am, denies bleeding . Unsure if she is going to keep pregnancy. Pt states she wants an ultrasound to determine how far along she is so she knows if she can still terminate.

## 2018-04-27 NOTE — Discharge Instructions (Signed)
Safe Medications in Pregnancy  ° °Acne: °Benzoyl Peroxide °Salicylic Acid ° °Backache/Headache: °Tylenol: 2 regular strength every 4 hours OR °             2 Extra strength every 6 hours ° °Colds/Coughs/Allergies: °Benadryl (alcohol free) 25 mg every 6 hours as needed °Breath right strips °Claritin °Cepacol throat lozenges °Chloraseptic throat spray °Cold-Eeze- up to three times per day °Cough drops, alcohol free °Flonase (by prescription only) °Guaifenesin °Mucinex °Robitussin DM (plain only, alcohol free) °Saline nasal spray/drops °Sudafed (pseudoephedrine) & Actifed ** use only after [redacted] weeks gestation and if you do not have high blood pressure °Tylenol °Vicks Vaporub °Zinc lozenges °Zyrtec  ° °Constipation: °Colace °Ducolax suppositories °Fleet enema °Glycerin suppositories °Metamucil °Milk of magnesia °Miralax °Senokot °Smooth move tea ° °Diarrhea: °Kaopectate °Imodium A-D ° °*NO pepto Bismol ° °Hemorrhoids: °Anusol °Anusol HC °Preparation H °Tucks ° °Indigestion: °Tums °Maalox °Mylanta °Zantac  °Pepcid ° °Insomnia: °Benadryl (alcohol free) 25mg every 6 hours as needed °Tylenol PM °Unisom, no Gelcaps ° °Leg Cramps: °Tums °MagGel ° °Nausea/Vomiting:  °Bonine °Dramamine °Emetrol °Ginger extract °Sea bands °Meclizine  °Nausea medication to take during pregnancy:  °Unisom (doxylamine succinate 25 mg tablets) Take one tablet daily at bedtime. If symptoms are not adequately controlled, the dose can be increased to a maximum recommended dose of two tablets daily (1/2 tablet in the morning, 1/2 tablet mid-afternoon and one at bedtime). °Vitamin B6 100mg tablets. Take one tablet twice a day (up to 200 mg per day). ° °Skin Rashes: °Aveeno products °Benadryl cream or 25mg every 6 hours as needed °Calamine Lotion °1% cortisone cream ° °Yeast infection: °Gyne-lotrimin 7 °Monistat 7 ° ° °**If taking multiple medications, please check labels to avoid duplicating the same active ingredients °**take medication as directed on  the label °** Do not exceed 4000 mg of tylenol in 24 hours °**Do not take medications that contain aspirin or ibuprofen ° ° ° °[facility] [facility]  ° ° °[facility]       Phone: [phone] ° °[facility] Phone: [phone] ° °[facility]  Phone: [phone] ° °[facility]  Phone: [phone] ° °[facility]  Phone: [phone] ° °[facility]       Phone: [phone] ° °[facility]    Phone: [phone]  ° °[facility]    Phone: [phone] ° °[facility]    Phone: [phone] ° °[facility]    Phone: [phone] ° °[facility]    Phone: [phone] ° °[facility]       Phone: [phone]  ° °[facility]  Phone: [phone] ° °[facility]    Phone: [phone] ° °[facility]   Phone: [phone] ° °[facility]      Phone: [phone] ° °[facility]    Phone: [phone] ° ° °

## 2018-04-27 NOTE — MAU Provider Note (Signed)
[name] is a 22 y.o. female at 8154w1d gestation returning to MAU for U/S because she had to leave earlier. She has already had an assessment and exam today.  [place] Ob Comp Less 14 Wks  Result Date: 04/27/2018 CLINICAL DATA:  Pregnant patient. Lower abdominal pain since yesterday. EXAM: OBSTETRIC <14 WK ULTRASOUND TECHNIQUE: Transabdominal ultrasound was performed for evaluation of the gestation as well as the maternal uterus and adnexal regions. COMPARISON:  None. FINDINGS: Intrauterine gestational sac: Single Yolk sac:  Visualized. Embryo:  Visualized. Cardiac Activity: Visualized. Heart Rate: 168 bpm MSD:   mm    w     d CRL:   26.8 mm   9 w 3 d                  US EDC: November 27, 2018 Subchorionic hemorrhage:  A small subchorionic hemorrhage is noted. Maternal uterus/adnexae: The ovaries are normal in appearance. IMPRESSION: 1. Single live IUP. Small subchorionic hemorrhage. No other abnormalities. Electronically Signed   By: Gerome Samavid  Williams III M.D   On: 04/27/2018 19:07    A/P: Subchorionic hematoma in first trimester, single or unspecified fetus   Abdominal pain affecting pregnancy  Bacterial vaginitis   - Discussed results of U/S, blood work & wet prep - BV and recommendation for Rx for Flagyl or Metrogel (pt's choice). Rx for Flagyl 500 mg BID x 7 days sent. - Advised to take all medication Rx'd for complete tx - Advised that if GC/CT results are positive she would be called by an RN or APP from here to arrange treatment.  - [facility], abd pain in pregnancy, threatened miscarriage - Discharge patient - [facility] if not planning to terminate pregnancy as previously discussed - Advised to schedule TAB ASAP, if planning to terminate pregnancy - Patient verbalized an understanding of the plan of care and agrees.     Rachel Aguirre, CNM  04/27/2018 8:10 PM

## 2018-04-27 NOTE — MAU Provider Note (Signed)
History     CSN: [id number]  Arrival date and time: 04/27/18 1211   First Provider Initiated Contact with Patient 04/27/18 1516      Chief Complaint  Patient presents with  . Abdominal Pain   HPI  Ms.  Rachel Aguirre is a 22 y.o. year old 853P2002 female at 7423w5d weeks gestation who presents to MAU reporting lower abdominal pain that started yesterday 04/26/2018 and "really wants to know how far along she is, because she is not sure if she wants to keep the pregnancy." She reports that she had a normal LMP the "end of May". She reports that she had "some vaginal spotting recently."    Past Medical History:  Diagnosis Date  . Infection    UTI  . PONV (postoperative nausea and vomiting)    after C section    Past Surgical History:  Procedure Laterality Date  . CESAREAN SECTION N/A 03/27/2015   Procedure: CESAREAN SECTION;  Surgeon: Brock Badharles A Harper, MD;  Location: [facility];  Service: Obstetrics;  Laterality: N/A;  . CESAREAN SECTION N/A 04/08/2016   Procedure: CESAREAN SECTION;  Surgeon: Gruver Bingharlie Pickens, MD;  [facility];  Service: Obstetrics;  Laterality: N/A;  . CHOLECYSTECTOMY N/A 01/06/2018   Procedure: LAPAROSCOPIC CHOLECYSTECTOMY WITH INTRAOPERATIVE CHOLANGIOGRAM;  Surgeon: Manus Ruddsuei, Matthew, MD;  Location: [facility];  Service: General;  Laterality: N/A;  . WISDOM TOOTH EXTRACTION      Family History  Problem Relation Age of Onset  . Hypertension Mother     Social History   Tobacco Use  . Smoking status: Never Smoker  . Smokeless tobacco: Never Used  Substance Use Topics  . Alcohol use: No  . Drug use: No    Allergies: No Known Allergies  Medications Prior to Admission  Medication Sig Dispense Refill Last Dose  . aspirin-acetaminophen-caffeine (EXCEDRIN MIGRAINE) 250-250-65 MG tablet Take by mouth every 8 (eight) hours as needed for headache.   Past Week at Unknown time  . cetirizine (ZYRTEC) 10 MG tablet Take 10 mg by mouth daily as needed for allergies.    More than a month at Unknown time  . oxyCODONE (OXY IR/ROXICODONE) 5 MG immediate release tablet Take 1 tablet (5 mg total) by mouth every 6 (six) hours as needed for severe pain. (Patient not taking: Reported on 04/27/2018) 20 tablet 0 Not Taking at Unknown time    Review of Systems Physical Exam   Blood pressure 121/70, pulse 82, temperature 98.8 F (37.1 C), temperature source Oral, resp. rate 15, height 5\' 4"  (1.626 m), weight 171 lb (77.6 kg), last menstrual period 01/26/2018, SpO2 100 %.  Physical Exam  Nursing note and vitals reviewed. Constitutional: She is oriented to person, place, and time. She appears well-developed and well-nourished.  HENT:  Head: Normocephalic and atraumatic.  Eyes: Pupils are equal, round, and reactive to light.  Neck: Normal range of motion.  Cardiovascular: Normal rate, regular rhythm, normal heart sounds and intact distal pulses.  Respiratory: Effort normal and breath sounds normal.  GI: Soft. Bowel sounds are normal.  Genitourinary:  Genitourinary Comments: Uterus: enlarged, S=D, SE: cervix is smooth, pink, no lesions, small amt of thick, white vaginal d/c -- WP, GC/CT done, closed/long/firm, no CMT or friability, no adnexal tenderness   Musculoskeletal: Normal range of motion.  Neurological: She is alert and oriented to person, place, and time. She has normal reflexes.  Skin: Skin is warm and dry.  Psychiatric: She has a normal mood and affect. Her behavior is  normal. Judgment and thought content normal.    MAU Course  Procedures  MDM CBC CMP Wet Prep GC/CT -- pending HIV -- pending OB U/S < 14 wks -- not ordered d/t patient leaving AMA prior to U/S  Results for orders placed or performed during the hospital encounter of 04/27/18 (from the past 24 hour(s))  CBC     Status: Abnormal   Collection Time: 04/27/18  3:54 PM  Result Value Ref Range   WBC 6.3 4.0 - 10.5 K/uL   RBC 3.92 3.87 - 5.11 MIL/uL   Hemoglobin 11.2 (L) 12.0 - 15.0 g/dL    HCT 91.4 (L) 78.2 - 46.0 %   MCV 84.2 78.0 - 100.0 fL   MCH 28.6 26.0 - 34.0 pg   MCHC 33.9 30.0 - 36.0 g/dL   RDW 95.6 21.3 - 08.6 %   Platelets 248 150 - 400 K/uL  Wet prep, genital     Status: Abnormal   Collection Time: 04/27/18  4:25 PM  Result Value Ref Range   Yeast Wet Prep HPF POC NONE SEEN NONE SEEN   Trich, Wet Prep NONE SEEN NONE SEEN   Clue Cells Wet Prep HPF POC PRESENT (A) NONE SEEN   WBC, Wet Prep HPF POC MODERATE (A) NONE SEEN   Sperm NONE SEEN     Assessment and Plan  Pregnancy with uncertain fetal viability, single or unspecified fetus  Bacterial vaginitis   - Patient left AMA reporting she had to p/u her children and "would return in ~ 1 hour" -- AMA form signed // patient instructed that when she returns she will have to begin the process of registration and triage all over again - Patient verbalized an understanding of the plan of care and agrees.   Raelyn Mora, MSN, CNM 04/27/2018, 3:17 PM

## 2018-04-27 NOTE — Progress Notes (Signed)
Pt left AMA, states she must go pick up her children but will return.

## 2018-04-27 NOTE — MAU Note (Signed)
Urine sent to lab 

## 2018-04-28 LAB — HIV ANTIBODY (ROUTINE TESTING W REFLEX): HIV Screen 4th Generation wRfx: NONREACTIVE

## 2018-04-28 LAB — GC/CHLAMYDIA PROBE AMP (~~LOC~~) NOT AT ARMC
CHLAMYDIA, DNA PROBE: POSITIVE — AB
NEISSERIA GONORRHEA: NEGATIVE

## 2018-05-05 ENCOUNTER — Telehealth: Payer: Self-pay | Admitting: Medical

## 2018-05-05 DIAGNOSIS — A749 Chlamydial infection, unspecified: Secondary | ICD-10-CM

## 2018-05-05 MED ORDER — AZITHROMYCIN 250 MG PO TABS: 1000.0000 mg | ORAL_TABLET | Freq: Once | ORAL | 0 refills | Status: AC

## 2018-05-05 NOTE — Telephone Encounter (Addendum)
Rachel Aguirre tested positive for  Chlamydia. Patient was called by RN and allergies and pharmacy confirmed. Rx sent to pharmacy of choice.   Marny LowensteinWenzel, Julie N, PA-C 05/05/2018 10:21 AM      ----- Message from Kathe BectonLori S Berdik, RN sent at 05/05/2018 10:15 AM EDT ----- This patient tested positive for :  Chlamydia.  She has NKDA", I have informed the patient of her results and confirmed her pharmacy is correct in her chart. Please send Rx.   Thank you,   Kathe BectonBerdik, Lori S, RN   [facility]

## 2018-06-12 ENCOUNTER — Ambulatory Visit (INDEPENDENT_AMBULATORY_CARE_PROVIDER_SITE_OTHER): Payer: Medicaid Other | Admitting: Obstetrics

## 2018-06-12 ENCOUNTER — Other Ambulatory Visit (HOSPITAL_COMMUNITY)
Admission: RE | Admit: 2018-06-12 | Discharge: 2018-06-12 | Disposition: A | Discharge status: Home / Self Care | Payer: Medicaid Other | Source: Ambulatory Visit | Attending: Obstetrics | Admitting: Obstetrics

## 2018-06-12 ENCOUNTER — Encounter: Payer: Self-pay | Admitting: Obstetrics

## 2018-06-12 VITALS — BP 106/62 | HR 73 | Wt 172.2 lb

## 2018-06-12 DIAGNOSIS — Z113 Encounter for screening for infections with a predominantly sexual mode of transmission: Secondary | ICD-10-CM | POA: Diagnosis not present

## 2018-06-12 DIAGNOSIS — Z01419 Encounter for gynecological examination (general) (routine) without abnormal findings: Secondary | ICD-10-CM | POA: Insufficient documentation

## 2018-06-12 DIAGNOSIS — Z3202 Encounter for pregnancy test, result negative: Secondary | ICD-10-CM

## 2018-06-12 DIAGNOSIS — Z3009 Encounter for other general counseling and advice on contraception: Secondary | ICD-10-CM

## 2018-06-12 DIAGNOSIS — Z0000 Encounter for general adult medical examination without abnormal findings: Secondary | ICD-10-CM

## 2018-06-12 DIAGNOSIS — N898 Other specified noninflammatory disorders of vagina: Secondary | ICD-10-CM

## 2018-06-12 LAB — POCT URINE PREGNANCY: Preg Test, Ur: NEGATIVE

## 2018-06-12 NOTE — Progress Notes (Signed)
Subjective:        Rachel Aguirre is a 22 y.o. female here for a routine exam.  Current complaints: None.    Personal health questionnaire:  Is patient Ashkenazi Jewish, have a family history of breast and/or ovarian cancer: no Is there a family history of uterine cancer diagnosed at age < 36, gastrointestinal cancer, urinary tract cancer, family member who is a Personnel officer syndrome-associated carrier: no Is the patient overweight and hypertensive, family history of diabetes, personal history of gestational diabetes, preeclampsia or PCOS: no Is patient over 57, have PCOS,  family history of premature CHD under age 35, diabetes, smoke, have hypertension or peripheral artery disease:  no At any time, has a partner hit, kicked or otherwise hurt or frightened you?: no Over the past 2 weeks, have you felt down, depressed or hopeless?: no Over the past 2 weeks, have you felt little interest or pleasure in doing things?:no   Gynecologic History No LMP recorded. Contraception: none Last Pap: none. Results were: none Last mammogram: none. Results were: none  Obstetric History OB History  Gravida Para Term Preterm AB Living  3 2 2   1 2   SAB TAB Ectopic Multiple Live Births        0 2    # Outcome Date GA Lbr Len/2nd Weight Sex Delivery Anes PTL Lv  3 AB 05/05/18 [redacted]w[redacted]d    TAB     2 Term 04/08/16 [redacted]w[redacted]d  7 lb 12.7 oz (3.535 kg) F CS-Vac EPI  LIV     Birth Comments: Low transverse hysterotomy repaired in multiple layers. Surgeon recommends in op not to consider treating as a classical with future deliveries @ 37 wks  1 Term 03/27/15 [redacted]w[redacted]d  6 lb 8.8 oz (2.97 kg) F CS-LTranv Spinal  LIV    Past Medical History:  Diagnosis Date  . Infection    UTI  . PONV (postoperative nausea and vomiting)    after C section    Past Surgical History:  Procedure Laterality Date  . CESAREAN SECTION N/A 03/27/2015   Procedure: CESAREAN SECTION;  Surgeon: Brock Bad, MD;  Location: [facility];  Service:  Obstetrics;  Laterality: N/A;  . CESAREAN SECTION N/A 04/08/2016   Procedure: CESAREAN SECTION;  Surgeon: Burns Bing, MD;  Location: [facility];  Service: Obstetrics;  Laterality: N/A;  . CHOLECYSTECTOMY N/A 01/06/2018   Procedure: LAPAROSCOPIC CHOLECYSTECTOMY WITH INTRAOPERATIVE CHOLANGIOGRAM;  Surgeon: Manus Rudd, MD;  Location: [facility];  Service: General;  Laterality: N/A;  . WISDOM TOOTH EXTRACTION       Current Outpatient Medications:  .  cetirizine (ZYRTEC) 10 MG tablet, Take 10 mg by mouth daily as needed for allergies., Disp: , Rfl:  No Known Allergies  Social History   Tobacco Use  . Smoking status: Never Smoker  . Smokeless tobacco: Never Used  Substance Use Topics  . Alcohol use: No    Family History  Problem Relation Age of Onset  . Hypertension Mother       Review of Systems  Constitutional: negative for fatigue and weight loss Respiratory: negative for cough and wheezing Cardiovascular: negative for chest pain, fatigue and palpitations Gastrointestinal: negative for abdominal pain and change in bowel habits Musculoskeletal:negative for myalgias Neurological: negative for gait problems and tremors Behavioral/Psych: negative for abusive relationship, depression Endocrine: negative for temperature intolerance    Genitourinary:negative for abnormal menstrual periods, genital lesions, hot flashes, sexual problems and vaginal discharge Integument/breast: negative for breast lump, breast tenderness, nipple  discharge and skin lesion(s)    Objective:       BP 106/62   Pulse 73   Wt 172 lb 3.2 oz (78.1 kg)   BMI 29.56 kg/m  General:   alert  Skin:   no rash or abnormalities  Lungs:   clear to auscultation bilaterally  Heart:   regular rate and rhythm, S1, S2 normal, no murmur, click, rub or gallop  Breasts:   normal without suspicious masses, skin or nipple changes or axillary nodes  Abdomen:  normal findings: no organomegaly, soft, non-tender and  no hernia  Pelvis:  External genitalia: normal general appearance Urinary system: urethral meatus normal and bladder without fullness, nontender Vaginal: normal without tenderness, induration or masses Cervix: normal appearance Adnexa: normal bimanual exam Uterus: anteverted and non-tender, normal size   Lab Review Urine pregnancy test Labs reviewed yes Radiologic studies reviewed no  50% of 20 min visit spent on counseling and coordination of care.   Assessment:     1. Encounter for routine gynecological examination with Papanicolaou smear of cervix Rx: - Cytology - PAP - POCT urine pregnancy  2. Vaginal discharge Rx: - Cervicovaginal ancillary only  3. Screening for STD (sexually transmitted disease) Rx: - HIV Antibody (routine testing w rflx) - Hepatitis B surface antigen - Hepatitis C antibody - RPR  4. Encounter for other general counseling and advice on contraception - considering options.  Will call back with decision.    Plan:    Education reviewed: calcium supplements, depression evaluation, low fat, low cholesterol diet, safe sex/STD prevention, self breast exams and weight bearing exercise. Contraception: considering options.  Educational brochures dispensed.. Follow up in: 1 year.   No orders of the defined types were placed in this encounter.  Orders Placed This Encounter  Procedures  . HIV Antibody (routine testing w rflx)  . Hepatitis B surface antigen  . Hepatitis C antibody  . RPR  . POCT urine pregnancy    Brock BadHARLES A. Kalayla Shadden MD 06-12-2018

## 2018-06-12 NOTE — Progress Notes (Signed)
Pt presents for all STD testing and to discuss BC.

## 2018-06-13 LAB — HEPATITIS C ANTIBODY

## 2018-06-13 LAB — RPR: RPR Ser Ql: NONREACTIVE

## 2018-06-13 LAB — HIV ANTIBODY (ROUTINE TESTING W REFLEX): HIV Screen 4th Generation wRfx: NONREACTIVE

## 2018-06-13 LAB — HEPATITIS B SURFACE ANTIGEN: Hepatitis B Surface Ag: NEGATIVE

## 2018-06-15 LAB — CERVICOVAGINAL ANCILLARY ONLY
Bacterial vaginitis: NEGATIVE
Candida vaginitis: NEGATIVE
Chlamydia: NEGATIVE
NEISSERIA GONORRHEA: NEGATIVE
Trichomonas: NEGATIVE

## 2018-06-16 LAB — CYTOLOGY - PAP
Adequacy: ABSENT
Diagnosis: NEGATIVE

## 2018-09-02 ENCOUNTER — Encounter (HOSPITAL_COMMUNITY): Payer: Self-pay

## 2018-09-02 ENCOUNTER — Ambulatory Visit (HOSPITAL_COMMUNITY)
Admission: EM | Admit: 2018-09-02 | Discharge: 2018-09-02 | Disposition: A | Discharge status: Home / Self Care | Payer: Medicaid Other | Attending: Family Medicine | Admitting: Family Medicine

## 2018-09-02 DIAGNOSIS — H02846 Edema of left eye, unspecified eyelid: Secondary | ICD-10-CM | POA: Diagnosis not present

## 2018-09-02 MED ORDER — PREDNISONE 10 MG (21) PO TBPK: ORAL_TABLET | Freq: Every day | ORAL | 0 refills | Status: DC

## 2018-09-02 NOTE — ED Provider Notes (Signed)
[facility]   [id number] 09/02/18 Arrival Time: 1008  ASSESSMENT & PLAN:  1. Swelling of eyelid, left    Question irritative/allergic. No sign of bacterial infection. Does not have typical blepharitis look to it either. Discussed. Watch closely.  Meds ordered this encounter  Medications  . predniSONE (STERAPRED UNI-PAK 21 TAB) 10 MG (21) TBPK tablet    Sig: Take by mouth daily. Take as directed.    Dispense:  21 tablet    Refill:  0   Follow-up Information    [facility].   Specialty:  Urgent Care Why:  Please return if you are not seeing improvement within the next 2-3 days. Contact information: [address] [place] [address] [phone]         May use OTC anti-histamine if desired.  Reviewed expectations re: course of current medical issues. Questions answered. Outlined signs and symptoms indicating need for more acute intervention. Patient verbalized understanding. After Visit Summary given.   SUBJECTIVE:  Rachel Aguirre is a 22 y.o. female who presents with complaint of persistent swelling of upper L eyelid. Onset gradual, approximately 2-3 days ago. Reports eyelid "feels irritated"; mild itch. Gradual worsening everyday. No new exposures identified. No eye drainage. Injury: no. Visual changes: no. Contact lens use: no. Self treatment: none.  ROS: As per HPI.  OBJECTIVE:  Vitals:   09/02/18 1149  BP: (!) 111/57  Pulse: 75  Resp: 16  Temp: 97.9 F (36.6 C)  TempSrc: Oral  SpO2: 100%    General appearance: alert; no distress Eyes: L upper eyelid is swollen with darkening of skin with normal skin temperature; no dry/irritated skin; no discharge; tender to touch; conjunctivae normal; PERRLA; EOMI; no light sensitivity; no hordeolum appreciated Neck: supple; no LAD Lungs: clear to auscultation bilaterally Heart: regular rate and rhythm Skin: warm and dry Psychological: alert and  cooperative; normal mood and affect  No Known Allergies  Past Medical History:  Diagnosis Date  . Infection    UTI  . PONV (postoperative nausea and vomiting)    after C section   Social History   Socioeconomic History  . Marital status: Single    Spouse name: Not on file  . Number of children: Not on file  . Years of education: Not on file  . Highest education level: Not on file  Occupational History  . Not on file  Social Needs  . Financial resource strain: Not on file  . Food insecurity:    Worry: Not on file    Inability: Not on file  . Transportation needs:    Medical: Not on file    Non-medical: Not on file  Tobacco Use  . Smoking status: Never Smoker  . Smokeless tobacco: Never Used  Substance and Sexual Activity  . Alcohol use: No  . Drug use: No  . Sexual activity: Yes    Birth control/protection: None  Lifestyle  . Physical activity:    Days per week: Not on file    Minutes per session: Not on file  . Stress: Not on file  Relationships  . Social connections:    Talks on phone: Not on file    Gets together: Not on file    Attends religious service: Not on file    Active member of club or organization: Not on file    Attends meetings of clubs or organizations: Not on file    Relationship status: Not on file  .  Intimate partner violence:    Fear of current or ex partner: Not on file    Emotionally abused: Not on file    Physically abused: Not on file    Forced sexual activity: Not on file  Other Topics Concern  . Not on file  Social History Narrative  . Not on file   Family History  Problem Relation Age of Onset  . Hypertension Mother    Past Surgical History:  Procedure Laterality Date  . CESAREAN SECTION N/A 03/27/2015   Procedure: CESAREAN SECTION;  Surgeon: Brock Badharles A Harper, MD;  Location: [facility];  Service: Obstetrics;  Laterality: N/A;  . CESAREAN SECTION N/A 04/08/2016   Procedure: CESAREAN SECTION;  Surgeon: [place] Bingharlie Pickens, MD;   [facility];  Service: Obstetrics;  Laterality: N/A;  . CHOLECYSTECTOMY N/A 01/06/2018   Procedure: LAPAROSCOPIC CHOLECYSTECTOMY WITH INTRAOPERATIVE CHOLANGIOGRAM;  Surgeon: Manus Ruddsuei, Matthew, MD;  [facility];  Service: General;  Laterality: N/A;  . WISDOM TOOTH EXTRACTION       Mardella LaymanHagler, Halleigh Comes, MD 09/02/18 1238

## 2018-09-02 NOTE — ED Notes (Signed)
Patient in hall asking for delays and wait times.  Updated at Dr. Tracie HarrierHagler was walking in

## 2018-09-02 NOTE — ED Triage Notes (Signed)
Pt present left eye swelling that started on Sunday.  Pt states her eye is very irritated.

## 2018-11-24 ENCOUNTER — Inpatient Hospital Stay (HOSPITAL_COMMUNITY)
Admission: AD | Admit: 2018-11-24 | Discharge: 2018-11-24 | Disposition: A | Discharge status: Home / Self Care | Payer: Medicaid Other | Attending: Obstetrics and Gynecology | Admitting: Obstetrics and Gynecology

## 2018-11-24 ENCOUNTER — Other Ambulatory Visit: Payer: Self-pay

## 2018-11-24 ENCOUNTER — Inpatient Hospital Stay (HOSPITAL_COMMUNITY): Payer: Medicaid Other

## 2018-11-24 ENCOUNTER — Encounter (HOSPITAL_COMMUNITY): Payer: Self-pay

## 2018-11-24 DIAGNOSIS — O209 Hemorrhage in early pregnancy, unspecified: Secondary | ICD-10-CM | POA: Insufficient documentation

## 2018-11-24 DIAGNOSIS — Z3A08 8 weeks gestation of pregnancy: Secondary | ICD-10-CM

## 2018-11-24 DIAGNOSIS — O4691 Antepartum hemorrhage, unspecified, first trimester: Secondary | ICD-10-CM | POA: Diagnosis not present

## 2018-11-24 DIAGNOSIS — O4690 Antepartum hemorrhage, unspecified, unspecified trimester: Secondary | ICD-10-CM

## 2018-11-24 DIAGNOSIS — O360110 Maternal care for anti-D [Rh] antibodies, first trimester, not applicable or unspecified: Secondary | ICD-10-CM | POA: Diagnosis not present

## 2018-11-24 DIAGNOSIS — O3680X0 Pregnancy with inconclusive fetal viability, not applicable or unspecified: Secondary | ICD-10-CM

## 2018-11-24 DIAGNOSIS — Z6790 Unspecified blood type, Rh positive: Secondary | ICD-10-CM

## 2018-11-24 DIAGNOSIS — Z3A00 Weeks of gestation of pregnancy not specified: Secondary | ICD-10-CM | POA: Diagnosis not present

## 2018-11-24 LAB — WET PREP, GENITAL
Sperm: NONE SEEN
Trich, Wet Prep: NONE SEEN
Yeast Wet Prep HPF POC: NONE SEEN

## 2018-11-24 LAB — CBC
HCT: 30.6 % — ABNORMAL LOW (ref 36.0–46.0)
Hemoglobin: 9.9 g/dL — ABNORMAL LOW (ref 12.0–15.0)
MCH: 28.1 pg (ref 26.0–34.0)
MCHC: 32.4 g/dL (ref 30.0–36.0)
MCV: 86.9 fL (ref 80.0–100.0)
NRBC: 0 % (ref 0.0–0.2)
PLATELETS: 235 10*3/uL (ref 150–400)
RBC: 3.52 MIL/uL — ABNORMAL LOW (ref 3.87–5.11)
RDW: 13.8 % (ref 11.5–15.5)
WBC: 5.1 10*3/uL (ref 4.0–10.5)

## 2018-11-24 LAB — URINALYSIS, ROUTINE W REFLEX MICROSCOPIC
Bilirubin Urine: NEGATIVE
GLUCOSE, UA: NEGATIVE mg/dL
Ketones, ur: NEGATIVE mg/dL
Leukocytes,Ua: NEGATIVE
Nitrite: NEGATIVE
PH: 8 (ref 5.0–8.0)
Protein, ur: 100 mg/dL — AB
SPECIFIC GRAVITY, URINE: 1.024 (ref 1.005–1.030)

## 2018-11-24 LAB — POCT PREGNANCY, URINE: PREG TEST UR: POSITIVE — AB

## 2018-11-24 LAB — HCG, QUANTITATIVE, PREGNANCY: HCG, BETA CHAIN, QUANT, S: 930 m[IU]/mL — AB (ref <5)

## 2018-11-24 MED ORDER — ACETAMINOPHEN 500 MG PO TABS
1000.0000 mg | ORAL_TABLET | Freq: Four times a day (QID) | ORAL | Status: DC | PRN
  Administered 2018-11-24: 1000 mg via ORAL
  Filled 2018-11-24: qty 2

## 2018-11-24 NOTE — MAU Provider Note (Signed)
History     CSN: [id number]  Arrival date and time: 11/24/18 1456   First Provider Initiated Contact with Patient 11/24/18 1533      Chief Complaint  Patient presents with  . Vaginal Bleeding   G4P2012 @[redacted]w[redacted]d  by LMP presenting with VB. VB started 5 days ago. Reports flow is similar to a period. She has passed small blood clots. Is also having mild uterine cramping.    OB History    Gravida  4   Para  2   Term  2   Preterm      AB  1   Living  2     SAB      TAB      Ectopic      Multiple  0   Live Births  2           Past Medical History:  Diagnosis Date  . Infection    UTI  . PONV (postoperative nausea and vomiting)    after C section    Past Surgical History:  Procedure Laterality Date  . CESAREAN SECTION N/A 03/27/2015   Procedure: CESAREAN SECTION;  Surgeon: Brock Bad, MD;  Location: [facility];  Service: Obstetrics;  Laterality: N/A;  . CESAREAN SECTION N/A 04/08/2016   Procedure: CESAREAN SECTION;  Surgeon: Edmunds Bing, MD;  Location: [facility];  Service: Obstetrics;  Laterality: N/A;  . CHOLECYSTECTOMY N/A 01/06/2018   Procedure: LAPAROSCOPIC CHOLECYSTECTOMY WITH INTRAOPERATIVE CHOLANGIOGRAM;  Surgeon: Manus Rudd, MD;  Location: [facility];  Service: General;  Laterality: N/A;  . WISDOM TOOTH EXTRACTION      Family History  Problem Relation Age of Onset  . Hypertension Mother     Social History   Tobacco Use  . Smoking status: Never Smoker  . Smokeless tobacco: Never Used  Substance Use Topics  . Alcohol use: No  . Drug use: No    Allergies: No Known Allergies  No medications prior to admission.    Review of Systems  Gastrointestinal: Positive for abdominal pain.  Genitourinary: Positive for vaginal bleeding. Negative for dysuria and urgency.   Physical Exam   Blood pressure 119/68, pulse 68, temperature 98.9 F (37.2 C), temperature source Oral, resp. rate 20, last menstrual period 09/23/2018, SpO2 100  %.  Physical Exam  Nursing note and vitals reviewed. Constitutional: She is oriented to person, place, and time. She appears well-developed and well-nourished. No distress.  HENT:  Head: Normocephalic and atraumatic.  Neck: Normal range of motion.  Cardiovascular: Normal rate.  Respiratory: Effort normal. No respiratory distress.  GI: Soft. She exhibits no distension and no mass. There is no abdominal tenderness. There is no rebound and no guarding.  Genitourinary:    Genitourinary Comments: External: no lesions or erythema Vagina: rugated, pink, moist, moderate bloody discharge and clots, cleared with 1 fox swab Uterus: non enlarged, anteverted, non tender, no CMT Adnexae: no masses, no tenderness left, no tenderness right Cervix closed    Musculoskeletal: Normal range of motion.  Neurological: She is alert and oriented to person, place, and time.  Skin: Skin is warm and dry.   Results for orders placed or performed during the hospital encounter of 11/24/18 (from the past 24 hour(s))  Pregnancy, urine POC     Status: Abnormal   Collection Time: 11/24/18  3:10 PM  Result Value Ref Range   Preg Test, Ur POSITIVE (A) NEGATIVE  Urinalysis, Routine w reflex microscopic     Status: Abnormal   Collection  Time: 11/24/18  3:14 PM  Result Value Ref Range   Color, Urine RED (A) YELLOW   APPearance CLOUDY (A) CLEAR   Specific Gravity, Urine 1.024 1.005 - 1.030   pH 8.0 5.0 - 8.0   Glucose, UA NEGATIVE NEGATIVE mg/dL   Hgb urine dipstick LARGE (A) NEGATIVE   Bilirubin Urine NEGATIVE NEGATIVE   Ketones, ur NEGATIVE NEGATIVE mg/dL   Protein, ur 254 (A) NEGATIVE mg/dL   Nitrite NEGATIVE NEGATIVE   Leukocytes,Ua NEGATIVE NEGATIVE  CBC     Status: Abnormal   Collection Time: 11/24/18  3:32 PM  Result Value Ref Range   WBC 5.1 4.0 - 10.5 K/uL   RBC 3.52 (L) 3.87 - 5.11 MIL/uL   Hemoglobin 9.9 (L) 12.0 - 15.0 g/dL   HCT 27.0 (L) 62.3 - 76.2 %   MCV 86.9 80.0 - 100.0 fL   MCH 28.1 26.0 -  34.0 pg   MCHC 32.4 30.0 - 36.0 g/dL   RDW 83.1 51.7 - 61.6 %   Platelets 235 150 - 400 K/uL   nRBC 0.0 0.0 - 0.2 %  hCG, quantitative, pregnancy     Status: Abnormal   Collection Time: 11/24/18  3:32 PM  Result Value Ref Range   hCG, Beta Chain, Quant, S 930 (H) <5 mIU/mL  Wet prep, genital     Status: Abnormal   Collection Time: 11/24/18  3:47 PM  Result Value Ref Range   Yeast Wet Prep HPF POC NONE SEEN NONE SEEN   Trich, Wet Prep NONE SEEN NONE SEEN   Clue Cells Wet Prep HPF POC PRESENT (A) NONE SEEN   WBC, Wet Prep HPF POC FEW (A) NONE SEEN   Sperm NONE SEEN    US Ob Less Than 14 Weeks With Ob Transvaginal  Result Date: 11/24/2018 CLINICAL DATA:  Vaginal bleeding since last Thursday with cramping. EXAM: OBSTETRIC <14 WK ULTRASOUND TECHNIQUE: Transabdominal ultrasound was performed for evaluation of the gestation as well as the maternal uterus and adnexal regions. COMPARISON:  None. FINDINGS: Intrauterine gestational sac: None Yolk sac:  Not Visualized. Embryo:  Not Visualized. Cardiac Activity: Not applicable Heart Rate: Not applicable Subchorionic hemorrhage:  None visualized. Maternal uterus/adnexae: Heterogeneous thickened endometrium. Ovaries are unremarkable with the right measuring 3.1 x 1.4 x 1.9 cm and the left measuring 2.6 x 1.7 x 2.3 cm. IMPRESSION: No intrauterine or definite ectopic pregnancy. Electronically Signed   By: Tollie Eth M.D.   On: 11/24/2018 16:24    MAU Course  Procedures Orders Placed This Encounter  Procedures  . Wet prep, genital    Standing Status:   Standing    Number of Occurrences:   1    Order Specific Question:   Patient immune status    Answer:   Normal  . US OB LESS THAN 14 WEEKS WITH OB TRANSVAGINAL    Standing Status:   Standing    Number of Occurrences:   1    Order Specific Question:   Symptom/Reason for Exam    Answer:   Vaginal bleeding in pregnancy [[id number]]  . Urinalysis, Routine w reflex microscopic    Standing Status:   Standing     Number of Occurrences:   1  . CBC    Standing Status:   Standing    Number of Occurrences:   1  . hCG, quantitative, pregnancy    Standing Status:   Standing    Number of Occurrences:   1  . Pregnancy, urine POC  Standing Status:   Standing    Number of Occurrences:   1  . Discharge patient    Order Specific Question:   Discharge disposition    Answer:   01-Home or Self Care [1]    Order Specific Question:   Discharge patient date    Answer:   11/24/2018   Meds ordered this encounter  Medications  . acetaminophen (TYLENOL) tablet 1,000 mg   MDM Labs and [place] ordered and reviewed. No IUP or adnexal mass seen on [place], findings could indicate early pregnancy, ectopic pregnancy, or failed pregnancy-discussed with pt. Will follow quant in 48 hrs.  Assessment and Plan   1. Pregnancy of unknown anatomic location   2. Vaginal bleeding in pregnancy   3. Blood type, Rh positive    Discharge home Follow up at [facility] in 2 days for stat hcg Ectopic/return precautions Tylenol prn  Allergies as of 11/24/2018   No Known Allergies     Medication List    STOP taking these medications   predniSONE 10 MG (21) Tbpk tablet Commonly known as:  STERAPRED UNI-PAK 21 TAB     TAKE these medications   cetirizine 10 MG tablet Commonly known as:  ZYRTEC Take 10 mg by mouth daily as needed for allergies.      Donette Larry, CNM 11/24/2018, 5:03 PM

## 2018-11-24 NOTE — Discharge Instructions (Signed)
Vaginal Bleeding During Pregnancy, First Trimester    A small amount of bleeding (spotting) from the vagina is common during early pregnancy. Sometimes the bleeding is normal and does not cause problems. At other times, though, bleeding may be a sign of something serious. Tell your doctor about any bleeding from your vagina right away.  Follow these instructions at home:  Activity  · Follow your doctor's instructions about how active you can be.  · If needed, make plans for someone to help with your normal activities.  · Do not have sex or orgasms until your doctor says that this is safe.  General instructions  · Take over-the-counter and prescription medicines only as told by your doctor.  · Watch your condition for any changes.  · Write down:  ? The number of pads you use each day.  ? How often you change pads.  ? How soaked (saturated) your pads are.  · Do not use tampons.  · Do not douche.  · If you pass any tissue from your vagina, save it to show to your doctor.  · Keep all follow-up visits as told by your doctor. This is important.  Contact a doctor if:  · You have vaginal bleeding at any time while you are pregnant.  · You have cramps.  · You have a fever.  Get help right away if:  · You have very bad cramps in your back or belly (abdomen).  · You pass large clots or a lot of tissue from your vagina.  · Your bleeding gets worse.  · You feel light-headed.  · You feel weak.  · You pass out (faint).  · You have chills.  · You are leaking fluid from your vagina.  · You have a gush of fluid from your vagina.  Summary  · Sometimes vaginal bleeding during pregnancy is normal and does not cause problems. At other times, bleeding may be a sign of something serious.  · Tell your doctor about any bleeding from your vagina right away.  · Follow your doctor's instructions about how active you can be. You may need someone to help you with your normal activities.  This information is not intended to replace advice given to  you by your health care provider. Make sure you discuss any questions you have with your health care provider.  Document Released: 01/24/2014 Document Revised: 12/11/2016 Document Reviewed: 12/11/2016  Elsevier Interactive Patient Education © 2019 [employer] Inc[employer]

## 2018-11-24 NOTE — MAU Note (Signed)
Pt states that she feels like she is having a miscarriage.   Pt states she took a pregnancy test one month ago and it was positive.   Pt states she starting bleeding last Thursday that has continued today.   She reports abdominal cramping.

## 2018-11-26 ENCOUNTER — Telehealth: Payer: Self-pay | Admitting: *Deleted

## 2018-11-26 ENCOUNTER — Ambulatory Visit: Payer: Medicaid Other

## 2018-11-26 LAB — GC/CHLAMYDIA PROBE AMP (~~LOC~~) NOT AT ARMC
CHLAMYDIA, DNA PROBE: NEGATIVE
Neisseria Gonorrhea: NEGATIVE

## 2018-11-26 NOTE — Telephone Encounter (Signed)
Rachel Aguirre did not keep her Stat BHCG appointment today . I called [name] and notified her she missed her stat bhcg appt  and she states she can come in the am around 0830-0900. I informed her she will have to wait about 2 hours for results. She voices understanding.

## 2018-11-27 ENCOUNTER — Other Ambulatory Visit: Payer: Self-pay

## 2018-11-27 ENCOUNTER — Inpatient Hospital Stay (HOSPITAL_COMMUNITY)
Admission: AD | Admit: 2018-11-27 | Discharge: 2018-11-27 | Disposition: A | Discharge status: Home / Self Care | Payer: Medicaid Other | Source: Ambulatory Visit | Attending: Obstetrics and Gynecology | Admitting: Obstetrics and Gynecology

## 2018-11-27 DIAGNOSIS — Z9049 Acquired absence of other specified parts of digestive tract: Secondary | ICD-10-CM | POA: Insufficient documentation

## 2018-11-27 DIAGNOSIS — O039 Complete or unspecified spontaneous abortion without complication: Secondary | ICD-10-CM | POA: Insufficient documentation

## 2018-11-27 LAB — HCG, QUANTITATIVE, PREGNANCY: HCG, BETA CHAIN, QUANT, S: 332 m[IU]/mL — AB (ref <5)

## 2018-11-27 NOTE — MAU Note (Signed)
Doing ok. Bleeding has almost stopped. Cramps every now and then. blood work ordered

## 2018-11-27 NOTE — MAU Provider Note (Signed)
Chief Complaint: bloodwork   First Provider Initiated Contact with Patient 11/27/18 1122      SUBJECTIVE HPI: Rachel Aguirre is a 23 y.o. [id number] at [redacted]w[redacted]d by LMP who presents to maternity admissions reporting cramping and bleeding have mostly resolved.  [place] on 3/3 with pregnancy of unknown location, quant hcg of 930. She was not able to come to the office 3/5 or this morning so came to MAU. There are no other symptoms. She has not tried any treatments.   HPI  Past Medical History:  Diagnosis Date  . Infection    UTI  . PONV (postoperative nausea and vomiting)    after C section   Past Surgical History:  Procedure Laterality Date  . CESAREAN SECTION N/A 03/27/2015   Procedure: CESAREAN SECTION;  Surgeon: Brock Bad, MD;  Location: [facility];  Service: Obstetrics;  Laterality: N/A;  . CESAREAN SECTION N/A 04/08/2016   Procedure: CESAREAN SECTION;  Surgeon: [place] Bing, MD;  Location: [facility] BIRTHING SUITES;  Service: Obstetrics;  Laterality: N/A;  . CHOLECYSTECTOMY N/A 01/06/2018   Procedure: LAPAROSCOPIC CHOLECYSTECTOMY WITH INTRAOPERATIVE CHOLANGIOGRAM;  Surgeon: Manus Rudd, MD;  Location: [facility] OR;  Service: General;  Laterality: N/A;  . WISDOM TOOTH EXTRACTION     Social History   Socioeconomic History  . Marital status: Single    Spouse name: Not on file  . Number of children: Not on file  . Years of education: Not on file  . Highest education level: Not on file  Occupational History  . Not on file  Social Needs  . Financial resource strain: Not on file  . Food insecurity:    Worry: Not on file    Inability: Not on file  . Transportation needs:    Medical: Not on file    Non-medical: Not on file  Tobacco Use  . Smoking status: Never Smoker  . Smokeless tobacco: Never Used  Substance and Sexual Activity  . Alcohol use: No  . Drug use: No  . Sexual activity: Yes    Birth control/protection: None  Lifestyle  . Physical activity:    Days per week: Not on file     Minutes per session: Not on file  . Stress: Not on file  Relationships  . Social connections:    Talks on phone: Not on file    Gets together: Not on file    Attends religious service: Not on file    Active member of club or organization: Not on file    Attends meetings of clubs or organizations: Not on file    Relationship status: Not on file  . Intimate partner violence:    Fear of current or ex partner: Not on file    Emotionally abused: Not on file    Physically abused: Not on file    Forced sexual activity: Not on file  Other Topics Concern  . Not on file  Social History Narrative  . Not on file   No current facility-administered medications on file prior to encounter.    Current Outpatient Medications on File Prior to Encounter  Medication Sig Dispense Refill  . cetirizine (ZYRTEC) 10 MG tablet Take 10 mg by mouth daily as needed for allergies.     No Known Allergies  ROS:  Review of Systems  Constitutional: Negative for chills, fatigue and fever.  Respiratory: Negative for shortness of breath.   Cardiovascular: Negative for chest pain.  Gastrointestinal: Negative for abdominal pain.  Genitourinary: Positive for vaginal bleeding.  Negative for difficulty urinating, dysuria, flank pain, pelvic pain, vaginal discharge and vaginal pain.  Neurological: Negative for dizziness and headaches.  Psychiatric/Behavioral: Negative.      I have reviewed patient's Past Medical Hx, Surgical Hx, Family Hx, Social Hx, medications and allergies.   Physical Exam   Patient Vitals for the past 24 hrs:  BP Temp Temp src Pulse Resp SpO2  11/27/18 1111 119/65 99 F (37.2 C) Oral 60 18 100 %   Constitutional: Well-developed, well-nourished female in no acute distress.  Cardiovascular: normal rate Respiratory: normal effort GI: Abd soft, non-tender. Pos BS x 4 MS: Extremities nontender, no edema, normal ROM Neurologic: Alert and oriented x 4.  GU: Neg CVAT.  PELVIC EXAM:  Deferred  LAB RESULTS Results for orders placed or performed during the hospital encounter of 11/27/18 (from the past 24 hour(s))  hCG, quantitative, pregnancy     Status: Abnormal   Collection Time: 11/27/18 11:22 AM  Result Value Ref Range   hCG, Beta Chain, Quant, S 332 (H) <5 mIU/mL       IMAGING   MAU Management/MDM: Orders Placed This Encounter  Procedures  . hCG, quantitative, pregnancy  . Discharge patient    No orders of the defined types were placed in this encounter.   Significant drop in hcg c/w SAB.  Discussed with pt today. Pt desires contraception as soon as possible, wants to be followed at [facility].  Message sent to set up outpatient lab/visits with provider.  Return to MAU for emergencies.   ASSESSMENT 1. SAB (spontaneous abortion)     PLAN Discharge home  Allergies as of 11/27/2018   No Known Allergies     Medication List    TAKE these medications   cetirizine 10 MG tablet Commonly known as:  ZYRTEC Take 10 mg by mouth daily as needed for allergies.      Follow-up Information    [facility] Follow up.   Specialty:  Obstetrics and Gynecology Why:  Weekly labs in the office.  The office will call with appointments. Return to [facility] MAU as needed for emergencies. Contact information: [address] [place] [address] [phone]          Sharen Counter Certified Nurse-Midwife 11/27/2018  12:46 PM

## 2018-12-03 ENCOUNTER — Other Ambulatory Visit: Payer: Medicaid Other

## 2018-12-07 ENCOUNTER — Ambulatory Visit: Payer: Medicaid Other | Admitting: Advanced Practice Midwife

## 2018-12-15 ENCOUNTER — Other Ambulatory Visit: Payer: Medicaid Other

## 2018-12-15 ENCOUNTER — Other Ambulatory Visit: Payer: Self-pay

## 2018-12-15 DIAGNOSIS — O039 Complete or unspecified spontaneous abortion without complication: Secondary | ICD-10-CM

## 2018-12-16 LAB — BETA HCG QUANT (REF LAB): HCG QUANT: 2 m[IU]/mL

## 2018-12-22 ENCOUNTER — Telehealth: Payer: Self-pay | Admitting: Advanced Practice Midwife

## 2018-12-22 ENCOUNTER — Ambulatory Visit (INDEPENDENT_AMBULATORY_CARE_PROVIDER_SITE_OTHER): Payer: Medicaid Other

## 2018-12-22 ENCOUNTER — Other Ambulatory Visit: Payer: Self-pay

## 2018-12-22 DIAGNOSIS — Z3042 Encounter for surveillance of injectable contraceptive: Secondary | ICD-10-CM | POA: Diagnosis not present

## 2018-12-22 DIAGNOSIS — Z3202 Encounter for pregnancy test, result negative: Secondary | ICD-10-CM | POA: Diagnosis not present

## 2018-12-22 LAB — POCT URINE PREGNANCY: PREG TEST UR: NEGATIVE

## 2018-12-22 MED ORDER — MEDROXYPROGESTERONE ACETATE 150 MG/ML IM SUSP
150.0000 mg | INTRAMUSCULAR | Status: AC
  Administered 2018-12-22 – 2021-07-04 (×2): 150 mg via INTRAMUSCULAR

## 2018-12-22 MED ORDER — MEDROXYPROGESTERONE ACETATE 150 MG/ML IM SUSP: 150.0000 mg | INTRAMUSCULAR | 0 refills | Status: DC

## 2018-12-22 NOTE — Progress Notes (Signed)
Pt is in the office for depo injection, administered RUOQ and pt tolerated well. Next depo 6/16- 6/30 .Marland Kitchen Administrations This Visit    medroxyPROGESTERone (DEPO-PROVERA) injection 150 mg    Admin Date 12/22/2018 Action Given Dose 150 mg Route Intramuscular Administered By Katrina Stack, RN

## 2018-12-22 NOTE — Telephone Encounter (Signed)
Pt was seen in MAU 11/27/18 and desired Depo Provera as soon as possible after her miscarriage. She was scheduled 12/07/18 for hcg and Depo injection but missed her visit. She returned on 12/15/18 for hcg visit but did not receive Depo.  Hcg on 12/15/18 was 2 so no evidence of pregnancy.  Rx sent for Depo to pt [facility] pharmacy and office to schedule nurse visit for Depo injection today.

## 2018-12-24 NOTE — Progress Notes (Signed)
I have reviewed the chart and agree with nursing staff's documentation of this patient's encounter.  Catalina Antigua, MD 12/24/2018 11:18 AM

## 2019-03-23 ENCOUNTER — Other Ambulatory Visit: Payer: Self-pay

## 2019-03-23 ENCOUNTER — Ambulatory Visit (INDEPENDENT_AMBULATORY_CARE_PROVIDER_SITE_OTHER): Payer: Medicaid Other

## 2019-03-23 DIAGNOSIS — Z3042 Encounter for surveillance of injectable contraceptive: Secondary | ICD-10-CM | POA: Diagnosis not present

## 2019-03-23 MED ORDER — MEDROXYPROGESTERONE ACETATE 150 MG/ML IM SUSP
150.0000 mg | Freq: Once | INTRAMUSCULAR | Status: AC
  Administered 2019-03-23: 150 mg via INTRAMUSCULAR

## 2019-03-23 MED ORDER — MEDROXYPROGESTERONE ACETATE 150 MG/ML IM SUSP: 150.0000 mg | INTRAMUSCULAR | 3 refills | Status: AC

## 2019-03-23 NOTE — Progress Notes (Signed)
Pt presents for depo inj. Pt is within her window. Inj given in Right deltoid. Next depo due 9/15-9/29. Rx sent to the pharmacy.

## 2019-03-30 ENCOUNTER — Ambulatory Visit: Payer: Medicaid Other | Admitting: Sports Medicine

## 2019-06-14 ENCOUNTER — Ambulatory Visit: Payer: Medicaid Other

## 2019-06-24 ENCOUNTER — Telehealth: Payer: Self-pay | Admitting: Obstetrics

## 2019-08-17 ENCOUNTER — Ambulatory Visit: Payer: Medicaid Other

## 2020-03-07 ENCOUNTER — Encounter (HOSPITAL_COMMUNITY): Payer: Self-pay | Admitting: Emergency Medicine

## 2020-03-07 ENCOUNTER — Other Ambulatory Visit: Payer: Self-pay

## 2020-03-07 ENCOUNTER — Emergency Department (HOSPITAL_COMMUNITY): Payer: Medicaid Other

## 2020-03-07 ENCOUNTER — Emergency Department (HOSPITAL_COMMUNITY)
Admission: EM | Admit: 2020-03-07 | Discharge: 2020-03-07 | Disposition: A | Discharge status: Home / Self Care | Payer: Medicaid Other | Attending: Emergency Medicine | Admitting: Emergency Medicine

## 2020-03-07 DIAGNOSIS — K0889 Other specified disorders of teeth and supporting structures: Secondary | ICD-10-CM | POA: Diagnosis present

## 2020-03-07 DIAGNOSIS — K047 Periapical abscess without sinus: Secondary | ICD-10-CM | POA: Diagnosis not present

## 2020-03-07 DIAGNOSIS — K048 Radicular cyst: Secondary | ICD-10-CM | POA: Diagnosis not present

## 2020-03-07 DIAGNOSIS — L03211 Cellulitis of face: Secondary | ICD-10-CM | POA: Insufficient documentation

## 2020-03-07 LAB — BASIC METABOLIC PANEL
Anion gap: 8 (ref 5–15)
BUN: 9 mg/dL (ref 6–20)
CO2: 27 mmol/L (ref 22–32)
Calcium: 8.9 mg/dL (ref 8.9–10.3)
Chloride: 102 mmol/L (ref 98–111)
Creatinine, Ser: 0.74 mg/dL (ref 0.44–1.00)
GFR calc Af Amer: >60 mL/min (ref >60)
GFR calc non Af Amer: >60 mL/min (ref >60)
Glucose, Bld: 90 mg/dL (ref 70–99)
Potassium: 3.4 mmol/L — ABNORMAL LOW (ref 3.5–5.1)
Sodium: 137 mmol/L (ref 135–145)

## 2020-03-07 LAB — CBC WITH DIFFERENTIAL/PLATELET
Abs Immature Granulocytes: 0.01 10*3/uL (ref 0.00–0.07)
Basophils Absolute: 0 10*3/uL (ref 0.0–0.1)
Basophils Relative: 1 %
Eosinophils Absolute: 0.1 10*3/uL (ref 0.0–0.5)
Eosinophils Relative: 2 %
HCT: 35.9 % — ABNORMAL LOW (ref 36.0–46.0)
Hemoglobin: 11.3 g/dL — ABNORMAL LOW (ref 12.0–15.0)
Immature Granulocytes: 0 %
Lymphocytes Relative: 34 %
Lymphs Abs: 1.9 10*3/uL (ref 0.7–4.0)
MCH: 28 pg (ref 26.0–34.0)
MCHC: 31.5 g/dL (ref 30.0–36.0)
MCV: 89.1 fL (ref 80.0–100.0)
Monocytes Absolute: 0.6 10*3/uL (ref 0.1–1.0)
Monocytes Relative: 11 %
Neutro Abs: 2.9 10*3/uL (ref 1.7–7.7)
Neutrophils Relative %: 52 %
Platelets: 233 10*3/uL (ref 150–400)
RBC: 4.03 MIL/uL (ref 3.87–5.11)
RDW: 13.2 % (ref 11.5–15.5)
WBC: 5.6 10*3/uL (ref 4.0–10.5)
nRBC: 0 % (ref 0.0–0.2)

## 2020-03-07 LAB — I-STAT BETA HCG BLOOD, ED (MC, WL, AP ONLY): I-stat hCG, quantitative: <5 m[IU]/mL (ref <5)

## 2020-03-07 MED ORDER — CLINDAMYCIN HCL 300 MG PO CAPS
300.0000 mg | ORAL_CAPSULE | Freq: Four times a day (QID) | ORAL | 0 refills | Status: AC
Start: 2020-03-07 — End: ?

## 2020-03-07 MED ORDER — SODIUM CHLORIDE (PF) 0.9 % IJ SOLN
INTRAMUSCULAR | Status: AC
  Filled 2020-03-07: qty 50

## 2020-03-07 MED ORDER — SODIUM CHLORIDE 0.9 % IV BOLUS
1000.0000 mL | Freq: Once | INTRAVENOUS | Status: AC
  Administered 2020-03-07: 1000 mL via INTRAVENOUS

## 2020-03-07 MED ORDER — KETOROLAC TROMETHAMINE 30 MG/ML IJ SOLN
30.0000 mg | Freq: Once | INTRAMUSCULAR | Status: AC
  Administered 2020-03-07: 30 mg via INTRAVENOUS
  Filled 2020-03-07: qty 1

## 2020-03-07 MED ORDER — IBUPROFEN 800 MG PO TABS: 800.0000 mg | ORAL_TABLET | Freq: Three times a day (TID) | ORAL | 0 refills | Status: AC

## 2020-03-07 MED ORDER — CLINDAMYCIN PHOSPHATE 600 MG/50ML IV SOLN
600.0000 mg | Freq: Once | INTRAVENOUS | Status: AC
  Administered 2020-03-07: 600 mg via INTRAVENOUS
  Filled 2020-03-07: qty 50

## 2020-03-07 MED ORDER — IOHEXOL 300 MG/ML  SOLN
100.0000 mL | Freq: Once | INTRAMUSCULAR | Status: AC | PRN
  Administered 2020-03-07: 100 mL via INTRAVENOUS

## 2020-03-07 NOTE — ED Triage Notes (Signed)
Patient here from home with complaints of left upper dental pain that started yesterday with facial swelling.

## 2020-03-07 NOTE — ED Provider Notes (Signed)
[facility] DEPT Provider Note   CSN: [id number] Arrival date & time: 03/07/20  1407     History Chief Complaint  Patient presents with  . Dental Pain    Rachel Aguirre is a 24 y.o. female.  Rachel Aguirre is a 24 y.o. female with is otherwise healthy, who presents for evaluation of dental pain and facial swelling. Pt states symptoms just started yesterday. She first noticed pain around her left lower molars and jaw. She has to talk all day at work and this became increasingly painful. Towards the end of the day she started to notice facial swelling that has gotten progressively worse since then and is starting to track up towards her eye. No eye pain or drainage. No drainage from around the tooth. Chewing is painful, but no trouble swallowing or breathing. No fevers, nausea or vomiting, no meds prior to arrival. No prior history of similar dental issues.        Past Medical History:  Diagnosis Date  . Infection    UTI  . PONV (postoperative nausea and vomiting)    after C section    Patient Active Problem List   Diagnosis Date Noted  . Pregnant state, incidental 02/11/2018    Past Surgical History:  Procedure Laterality Date  . CESAREAN SECTION N/A 03/27/2015   Procedure: CESAREAN SECTION;  Surgeon: Shelly Bombard, MD;  Location: [facility];  Service: Obstetrics;  Laterality: N/A;  . CESAREAN SECTION N/A 04/08/2016   Procedure: CESAREAN SECTION;  Surgeon: Aletha Halim, MD;  Location: [facility];  Service: Obstetrics;  Laterality: N/A;  . CHOLECYSTECTOMY N/A 01/06/2018   Procedure: LAPAROSCOPIC CHOLECYSTECTOMY WITH INTRAOPERATIVE CHOLANGIOGRAM;  Surgeon: Donnie Mesa, MD;  Location: [facility];  Service: General;  Laterality: N/A;  . WISDOM TOOTH EXTRACTION       OB History    Gravida  4   Para  2   Term  2   Preterm      AB  1   Living  2     SAB      TAB      Ectopic      Multiple  0   Live Births  2            Family History  Problem Relation Age of Onset  . Hypertension Mother     Social History   Tobacco Use  . Smoking status: Never Smoker  . Smokeless tobacco: Never Used  Substance Use Topics  . Alcohol use: No  . Drug use: No    Home Medications Prior to Admission medications   Medication Sig Start Date End Date Taking? Authorizing Provider  cetirizine (ZYRTEC) 10 MG tablet Take 10 mg by mouth daily as needed for allergies.    [provider]  clindamycin (CLEOCIN) 300 MG capsule Take 1 capsule (300 mg total) by mouth 4 (four) times daily. X 7 days 03/07/20   Jacqlyn Larsen, PA-C  ibuprofen (ADVIL) 800 MG tablet Take 1 tablet (800 mg total) by mouth 3 (three) times daily. 03/07/20   Jacqlyn Larsen, PA-C  medroxyPROGESTERone (DEPO-PROVERA) 150 MG/ML injection Inject 1 mL (150 mg total) into the muscle every 3 (three) months. 12/22/18   Leftwich-Kirby, Kathie Dike, CNM  medroxyPROGESTERone (DEPO-PROVERA) 150 MG/ML injection Inject 1 mL (150 mg total) into the muscle every 3 (three) months. 03/23/19   Woodroe Mode, MD    Allergies    Patient has no known allergies.  Review of Systems   Review of Systems  Constitutional: Negative for chills and fever.  HENT: Positive for dental problem and facial swelling. Negative for sore throat and trouble swallowing.   Eyes: Negative for pain, discharge and redness.  Gastrointestinal: Negative for nausea and vomiting.  Musculoskeletal: Negative for neck pain and neck stiffness.  Skin: Negative for color change and rash.  Neurological: Negative for headaches.  All other systems reviewed and are negative.   Physical Exam Updated Vital Signs BP 129/78 (BP Location: Left Arm)   Pulse 96   Temp 98.6 F (37 C) (Oral)   Resp 18   SpO2 100%   Physical Exam Vitals and nursing note reviewed.  Constitutional:      General: She is not in acute distress.    Appearance: Normal appearance. She is well-developed and normal weight. She is  not ill-appearing or diaphoretic.     Comments: Well-appearing, non-toxic, no distress  HENT:     Head: Normocephalic and atraumatic.     Nose: Nose normal.     Mouth/Throat:     Comments: Swelling extending from left lower jaw up towards the eye, tender to palpation without crepitus, tenderness over left lower molar with surrounding erythema and mild edema of the gums, no obvious abscess or expressible drainage Eyes:     General:        Right eye: No discharge.        Left eye: No discharge.  Neck:     Comments: No tenderness, masses or torticollis, normal ROM Cardiovascular:     Rate and Rhythm: Normal rate and regular rhythm.     Pulses: Normal pulses.     Heart sounds: Normal heart sounds.  Pulmonary:     Effort: Pulmonary effort is normal. No respiratory distress.     Breath sounds: Normal breath sounds.  Musculoskeletal:        General: No deformity.     Cervical back: Neck supple. No rigidity or tenderness.  Skin:    General: Skin is warm and dry.  Neurological:     Mental Status: She is alert and oriented to person, place, and time.     Coordination: Coordination normal.  Psychiatric:        Mood and Affect: Mood normal.        Behavior: Behavior normal.         ED Results / Procedures / Treatments   Labs (all labs ordered are listed, but only abnormal results are displayed) Labs Reviewed  BASIC METABOLIC PANEL - Abnormal; Notable for the following components:      Result Value   Potassium 3.4 (*)    All other components within normal limits  CBC WITH DIFFERENTIAL/PLATELET - Abnormal; Notable for the following components:   Hemoglobin 11.3 (*)    HCT 35.9 (*)    All other components within normal limits  I-STAT BETA HCG BLOOD, ED ([facility], [facility], [facility] ONLY)    EKG None  Radiology CT Maxillofacial W Contrast  Result Date: 03/07/2020 CLINICAL DATA:  Left dental pain and facial swelling. EXAM: CT MAXILLOFACIAL WITH CONTRAST TECHNIQUE: Multidetector CT imaging of  the maxillofacial structures was performed with intravenous contrast. Multiplanar CT image reconstructions were also generated. CONTRAST:  OMNIPAQUE IOHEXOL 300 MG/ML  SOLN COMPARISON:  None. FINDINGS: Osseous: Previous root canal and crown of left maxillary tooth fourteen. Large periapical cyst bulging into the left maxillary sinus. Dental decay a of right maxillary tooth 2. Orbits: Normal Sinuses: Mucosal thickening  of the left maxillary sinus, likely odontogenic. Soft tissues: Nonspecific mild left facial superficial swelling consistent with cellulitis. No evidence of regional abscess. Parotid and submandibular glands are normal. Other soft tissues are unremarkable. Limited intracranial: Normal IMPRESSION: Previous root canal and crown of left maxillary tooth 14. Large periapical cyst at that level bulging into the left maxillary sinus with odontogenic inflammatory disease of the left maxillary sinus. Left facial cellulitis without evidence of drainable abscess. Dental decay right maxillary tooth 2. Electronically Signed   By: Paulina Fusi M.D.   On: 03/07/2020 16:45    Procedures Procedures (including critical care time)  Medications Ordered in ED Medications  sodium chloride (PF) 0.9 % injection (  Canceled Entry 03/07/20 1639)  clindamycin (CLEOCIN) IVPB 600 mg (0 mg Intravenous Stopped 03/07/20 1548)  sodium chloride 0.9 % bolus 1,000 mL (0 mLs Intravenous Stopped 03/07/20 1639)  ketorolac (TORADOL) 30 MG/ML injection 30 mg (30 mg Intravenous Given 03/07/20 1551)  iohexol (OMNIPAQUE) 300 MG/ML solution 100 mL (100 mLs Intravenous Contrast Given 03/07/20 1630)    ED Course  I have reviewed the triage vital signs and the nursing notes.  Pertinent labs & imaging results that were available during my care of the patient were reviewed by me and considered in my medical decision making (see chart for details).    MDM Rules/Calculators/A&P                          24 yo female presents with  left lower dental pain, symptoms have progressed with significant swelling in just 24 hours, concern for abscess and given sudden swelling feel imaging is indicated. Pt's ariway is intact with no stridor and neck tenderness. Will give meds for pain as well as dose of IV clinda while awaiting CT.  I have independently ordered, reviewed and interpreted all labs and imaging: Labs without leukocytosis, slight hypokalemia of 3.4, neg pregnancy  CT shows a periapical cyst that extends into the left maxillary sinus with odontogenic inflammatory disease, some facial cellulitis noted as well, but no drainable abscess. Will refer pt to oral surgery and dentistry and treat with continued clindamycin.  At this time there does not appear to be any evidence of an acute emergency medical condition and the patient appears stable for discharge with appropriate outpatient follow up.Diagnosis was discussed with patient who verbalizes understanding and is agreeable to discharge. Pt case discussed with Dr. Juleen China who agrees with my plan.    Final Clinical Impression(s) / ED Diagnoses Final diagnoses:  Periapical cyst  Facial cellulitis    Rx / DC Orders ED Discharge Orders         Ordered    clindamycin (CLEOCIN) 300 MG capsule  4 times daily     Discontinue  Reprint     03/07/20 1724    ibuprofen (ADVIL) 800 MG tablet  3 times daily     Discontinue  Reprint     03/07/20 1724           Legrand Rams 03/17/20 1215    Raeford Razor, MD 03/17/20 2241

## 2020-03-07 NOTE — Discharge Instructions (Signed)
Your CT scan shows that you have a cyst around the tooth that is causing you pain and this is likely what is causing the swelling as well it also shows some cellulitis and inflammation of the tissue around this.  Please take antibiotics 4 times daily as directed, use ibuprofen 800 mg 3 times daily and Tylenol 1000 mg 3 times daily.  Call tomorrow to schedule follow-up with neurosurgery, you can call Dr. Julien Girt who is on-call for the hospital, or Dr. Barbette Merino since you have had procedures done with him previously.  I have also provided a list of dental resources.  If you develop fevers, worsening swelling, difficulty swallowing, pain in the eye or any other new or concerning symptoms, return for reevaluation.

## 2020-08-11 ENCOUNTER — Other Ambulatory Visit (HOSPITAL_COMMUNITY)
Admission: RE | Admit: 2020-08-11 | Discharge: 2020-08-11 | Disposition: A | Discharge status: Home / Self Care | Payer: Medicaid Other | Source: Ambulatory Visit | Attending: Obstetrics | Admitting: Obstetrics

## 2020-08-11 ENCOUNTER — Encounter: Payer: Self-pay | Admitting: Obstetrics

## 2020-08-11 ENCOUNTER — Other Ambulatory Visit: Payer: Self-pay

## 2020-08-11 ENCOUNTER — Ambulatory Visit (INDEPENDENT_AMBULATORY_CARE_PROVIDER_SITE_OTHER): Payer: Medicaid Other | Admitting: Obstetrics

## 2020-08-11 VITALS — BP 110/65 | HR 65

## 2020-08-11 DIAGNOSIS — Z01419 Encounter for gynecological examination (general) (routine) without abnormal findings: Secondary | ICD-10-CM | POA: Diagnosis not present

## 2020-08-11 DIAGNOSIS — R87612 Low grade squamous intraepithelial lesion on cytologic smear of cervix (LGSIL): Secondary | ICD-10-CM | POA: Diagnosis not present

## 2020-08-11 DIAGNOSIS — N898 Other specified noninflammatory disorders of vagina: Secondary | ICD-10-CM | POA: Insufficient documentation

## 2020-08-11 DIAGNOSIS — Z3042 Encounter for surveillance of injectable contraceptive: Secondary | ICD-10-CM | POA: Diagnosis not present

## 2020-08-11 DIAGNOSIS — N760 Acute vaginitis: Secondary | ICD-10-CM | POA: Diagnosis not present

## 2020-08-11 DIAGNOSIS — B9689 Other specified bacterial agents as the cause of diseases classified elsewhere: Secondary | ICD-10-CM | POA: Diagnosis not present

## 2020-08-11 LAB — POCT URINE PREGNANCY: Preg Test, Ur: POSITIVE — AB

## 2020-08-11 MED ORDER — MEDROXYPROGESTERONE ACETATE 150 MG/ML IM SUSP: 150.0000 mg | INTRAMUSCULAR | 0 refills | Status: DC

## 2020-08-11 MED ORDER — METRONIDAZOLE 500 MG PO TABS: 500.0000 mg | ORAL_TABLET | Freq: Two times a day (BID) | ORAL | 2 refills | Status: DC

## 2020-08-11 MED ORDER — MEDROXYPROGESTERONE ACETATE 150 MG/ML IM SUSP: 150.0000 mg | Freq: Once | INTRAMUSCULAR | Status: DC

## 2020-08-11 NOTE — Progress Notes (Signed)
Pt presents for annual c/o malodorous and "clumpy" vaginal discharge Pt reqs to restart Depo; last unprotected IC two months ago per pt  UPT positive

## 2020-08-11 NOTE — Progress Notes (Signed)
Subjective:        Rachel Aguirre is a 24 y.o. female here for a routine exam.  Current complaints: Malodorous vaginal discharge.    Personal health questionnaire:  Is patient Ashkenazi Jewish, have a family history of breast and/or ovarian cancer: no Is there a family history of uterine cancer diagnosed at age < 28, gastrointestinal cancer, urinary tract cancer, family member who is a Personnel officer syndrome-associated carrier: no Is the patient overweight and hypertensive, family history of diabetes, personal history of gestational diabetes, preeclampsia or PCOS: no Is patient over 13, have PCOS,  family history of premature CHD under age 11, diabetes, smoke, have hypertension or peripheral artery disease:  no At any time, has a partner hit, kicked or otherwise hurt or frightened you?: no Over the past 2 weeks, have you felt down, depressed or hopeless?: no Over the past 2 weeks, have you felt little interest or pleasure in doing things?:no   Gynecologic History No LMP recorded. Contraception: none Last Pap: 06-12-2018. Results were: normal Last mammogram: n/a. Results were: n/a  Obstetric History OB History  Gravida Para Term Preterm AB Living  4 2 2   1 2   SAB TAB Ectopic Multiple Live Births        0 2    # Outcome Date GA Lbr Len/2nd Weight Sex Delivery Anes PTL Lv  4 Gravida           3 AB 05/05/18 [redacted]w[redacted]d    TAB     2 Term 04/08/16 [redacted]w[redacted]d  7 lb 12.7 oz (3.535 kg) F CS-Vac EPI  LIV     Birth Comments: Low transverse hysterotomy repaired in multiple layers. Surgeon recommends in op not to consider treating as a classical with future deliveries @ 37 wks  1 Term 03/27/15 [redacted]w[redacted]d  6 lb 8.8 oz (2.97 kg) F CS-LTranv Spinal  LIV    Past Medical History:  Diagnosis Date  . Infection    UTI  . PONV (postoperative nausea and vomiting)    after C section    Past Surgical History:  Procedure Laterality Date  . CESAREAN SECTION N/A 03/27/2015   Procedure: CESAREAN SECTION;  Surgeon:  05/28/2015, MD;  Location: [facility];  Service: Obstetrics;  Laterality: N/A;  . CESAREAN SECTION N/A 04/08/2016   Procedure: CESAREAN SECTION;  Surgeon: 04/10/2016, MD;  Location: [facility];  Service: Obstetrics;  Laterality: N/A;  . CHOLECYSTECTOMY N/A 01/06/2018   Procedure: LAPAROSCOPIC CHOLECYSTECTOMY WITH INTRAOPERATIVE CHOLANGIOGRAM;  Surgeon: 01/08/2018, MD;  Location: [facility] OR;  Service: General;  Laterality: N/A;  . WISDOM TOOTH EXTRACTION       Current Outpatient Medications:  .  cetirizine (ZYRTEC) 10 MG tablet, Take 10 mg by mouth daily as needed for allergies., Disp: , Rfl:  .  clindamycin (CLEOCIN) 300 MG capsule, Take 1 capsule (300 mg total) by mouth 4 (four) times daily. X 7 days (Patient not taking: Reported on 08/11/2020), Disp: 28 capsule, Rfl: 0 .  ibuprofen (ADVIL) 800 MG tablet, Take 1 tablet (800 mg total) by mouth 3 (three) times daily. (Patient not taking: Reported on 08/11/2020), Disp: 21 tablet, Rfl: 0 .  medroxyPROGESTERone (DEPO-PROVERA) 150 MG/ML injection, Inject 1 mL (150 mg total) into the muscle every 3 (three) months. (Patient not taking: Reported on 08/11/2020), Disp: 1 mL, Rfl: 3 .  medroxyPROGESTERone (DEPO-PROVERA) 150 MG/ML injection, Inject 1 mL (150 mg total) into the muscle every 3 (three) months., Disp: 1 mL, Rfl: 0  Current Facility-Administered Medications:  .  medroxyPROGESTERone (DEPO-PROVERA) injection 150 mg, 150 mg, Intramuscular, Q90 days, Leftwich-Kirby, Lisa A, CNM, 150 mg at 12/22/18 1601 .  medroxyPROGESTERone (DEPO-PROVERA) injection 150 mg, 150 mg, Intramuscular, Once, Brock Bad, MD No Known Allergies  Social History   Tobacco Use  . Smoking status: Never Smoker  . Smokeless tobacco: Never Used  Substance Use Topics  . Alcohol use: No    Family History  Problem Relation Age of Onset  . Hypertension Mother       Review of Systems  Constitutional: negative for fatigue and weight loss Respiratory:  negative for cough and wheezing Cardiovascular: negative for chest pain, fatigue and palpitations Gastrointestinal: negative for abdominal pain and change in bowel habits Musculoskeletal:negative for myalgias Neurological: negative for gait problems and tremors Behavioral/Psych: negative for abusive relationship, depression Endocrine: negative for temperature intolerance    Genitourinary:negative for abnormal menstrual periods, genital lesions, hot flashes, sexual problems and vaginal discharge Integument/breast: negative for breast lump, breast tenderness, nipple discharge and skin lesion(s)    Objective:       BP 110/65   Pulse 65  General:   alert and no distress  Skin:   no rash or abnormalities  Lungs:   clear to auscultation bilaterally  Heart:   regular rate and rhythm, S1, S2 normal, no murmur, click, rub or gallop  Breasts:   normal without suspicious masses, skin or nipple changes or axillary nodes  Abdomen:  normal findings: no organomegaly, soft, non-tender and no hernia  Pelvis:  External genitalia: normal general appearance Urinary system: urethral meatus normal and bladder without fullness, nontender Vaginal: normal without tenderness, induration or masses Cervix: normal appearance Adnexa: normal bimanual exam Uterus: anteverted and non-tender, normal size   Lab Review Urine pregnancy test Labs reviewed yes Radiologic studies reviewed no  50% of 20 min visit spent on counseling and coordination of care.   Assessment:     1. Encounter for routine gynecological examination with Papanicolaou smear of cervix Rx: - Cytology - PAP( [place])  2. Vaginal discharge Rx: - Cervicovaginal ancillary only( [place])  3. BV (bacterial vaginosis) Rx: - metroNIDAZOLE (FLAGYL) 500 MG tablet; Take 1 tablet (500 mg total) by mouth 2 (two) times daily.  Dispense: 14 tablet; Refill: 2  4. Surveillance for Depo-Provera contraception Rx: - POCT urine pregnancy:   Positive    Plan:    Education reviewed: calcium supplements, depression evaluation, low fat, low cholesterol diet, safe sex/STD prevention, self breast exams and weight bearing exercise. Follow up in: 1 year.   Meds ordered this encounter  Medications  . medroxyPROGESTERone (DEPO-PROVERA) 150 MG/ML injection    Sig: Inject 1 mL (150 mg total) into the muscle every 3 (three) months.    Dispense:  1 mL    Refill:  0  . medroxyPROGESTERone (DEPO-PROVERA) injection 150 mg   Orders Placed This Encounter  Procedures  . POCT urine pregnancy    Brock Bad, MD 08/11/2020 11:09 AM

## 2020-08-14 LAB — CERVICOVAGINAL ANCILLARY ONLY
Bacterial Vaginitis (gardnerella): POSITIVE — AB
Candida Glabrata: NEGATIVE
Candida Vaginitis: POSITIVE — AB
Chlamydia: NEGATIVE
Comment: NEGATIVE
Comment: NEGATIVE
Comment: NEGATIVE
Comment: NEGATIVE
Comment: NEGATIVE
Comment: NORMAL
Neisseria Gonorrhea: NEGATIVE
Trichomonas: NEGATIVE

## 2020-08-15 ENCOUNTER — Telehealth: Payer: Self-pay

## 2020-08-15 ENCOUNTER — Other Ambulatory Visit: Payer: Self-pay | Admitting: Obstetrics

## 2020-08-15 DIAGNOSIS — B3731 Acute candidiasis of vulva and vagina: Secondary | ICD-10-CM

## 2020-08-15 DIAGNOSIS — B373 Candidiasis of vulva and vagina: Secondary | ICD-10-CM

## 2020-08-15 LAB — CYTOLOGY - PAP

## 2020-08-15 MED ORDER — FLUCONAZOLE 150 MG PO TABS: 150.0000 mg | ORAL_TABLET | Freq: Once | ORAL | 0 refills | Status: AC

## 2020-08-15 NOTE — Telephone Encounter (Signed)
TC to pt since last active MyChart log in was 03/2019 No answer on provided contact and was not directed to a VM.

## 2020-08-15 NOTE — Telephone Encounter (Signed)
-----   Message from Brock Bad, MD sent at 08/15/2020  6:42 AM EST ----- Flagyl Rx for BV Diflucan Rx for yeast

## 2020-08-16 ENCOUNTER — Telehealth: Payer: Self-pay

## 2020-08-16 NOTE — Telephone Encounter (Signed)
TC to pt regarding pap no answer no vm  Pt already has COLPO scheduled for 09/01/20

## 2020-08-16 NOTE — Telephone Encounter (Signed)
-----   Message from Brock Bad, MD sent at 08/15/2020  4:14 PM EST ----- LGSIL pap smear.  Schedule colposcopy.

## 2020-09-01 ENCOUNTER — Other Ambulatory Visit: Payer: Self-pay

## 2020-09-01 ENCOUNTER — Encounter: Payer: Self-pay | Admitting: Obstetrics

## 2020-09-01 ENCOUNTER — Ambulatory Visit (INDEPENDENT_AMBULATORY_CARE_PROVIDER_SITE_OTHER): Payer: Medicaid Other | Admitting: Obstetrics

## 2020-09-01 VITALS — BP 124/80 | HR 89 | Wt 160.0 lb

## 2020-09-01 DIAGNOSIS — R87612 Low grade squamous intraepithelial lesion on cytologic smear of cervix (LGSIL): Secondary | ICD-10-CM | POA: Diagnosis not present

## 2020-09-01 LAB — POCT URINE PREGNANCY: Preg Test, Ur: POSITIVE — AB

## 2020-09-01 IMAGING — US US OB < 14 WEEKS - US OB TV
1 series · 15 of 28 positions shown · non-contrast
Comparison: None.

CLINICAL DATA: Vaginal bleeding since [REDACTED] with cramping.

EXAM:
OBSTETRIC <14 WK ULTRASOUND
TECHNIQUE: Transabdominal ultrasound was performed for evaluation of the
gestation as well as the maternal uterus and adnexal regions.

[Series 1: us ob < 14 weeks - us ob tv · 15 of 36 slices shown]
[im 1/36]
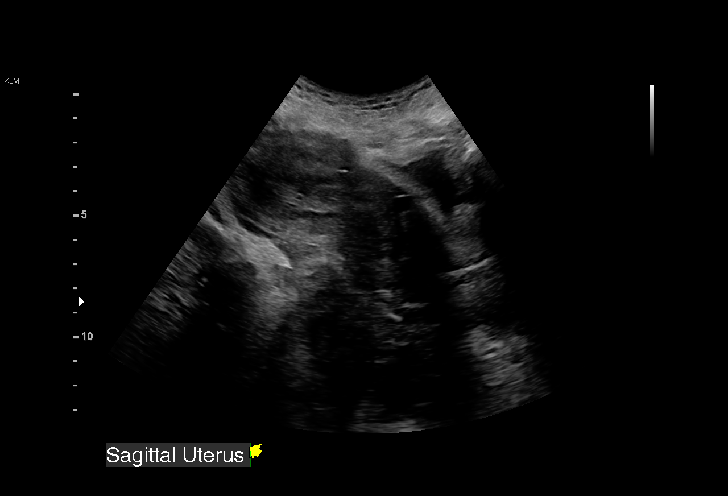
[im 3/36]
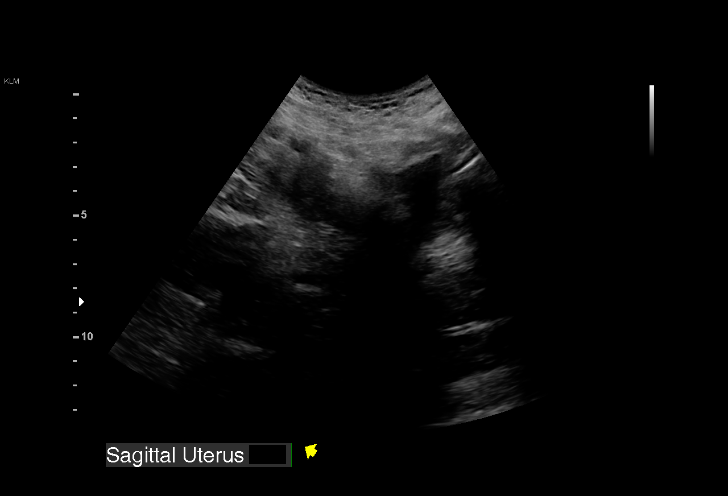
[im 6/36]
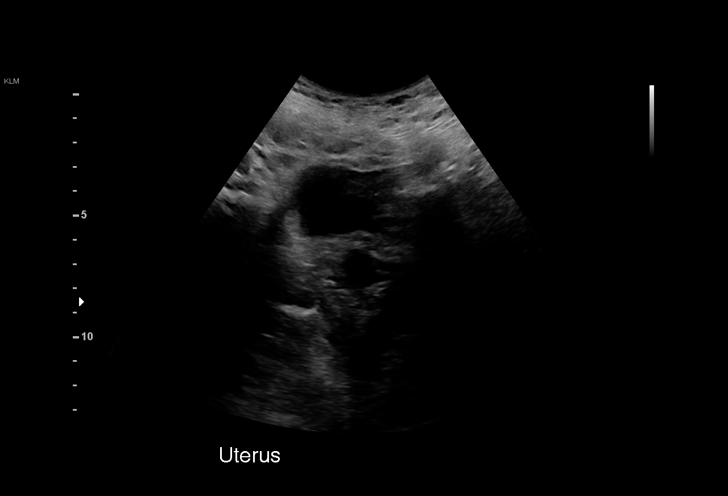
[im 8/36]
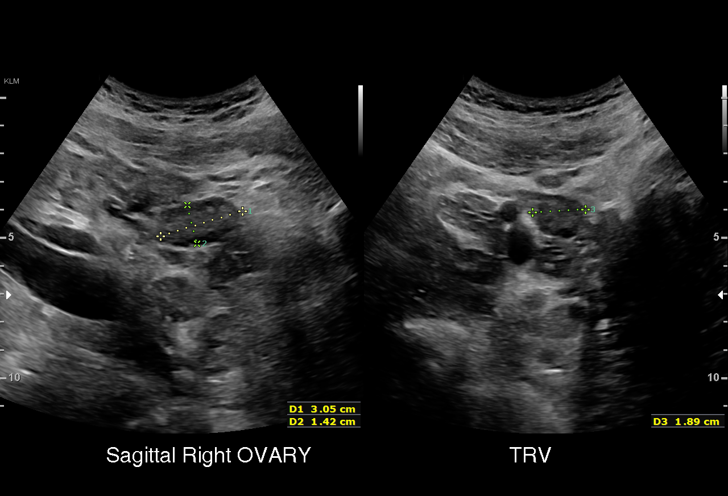
[im 11/36]
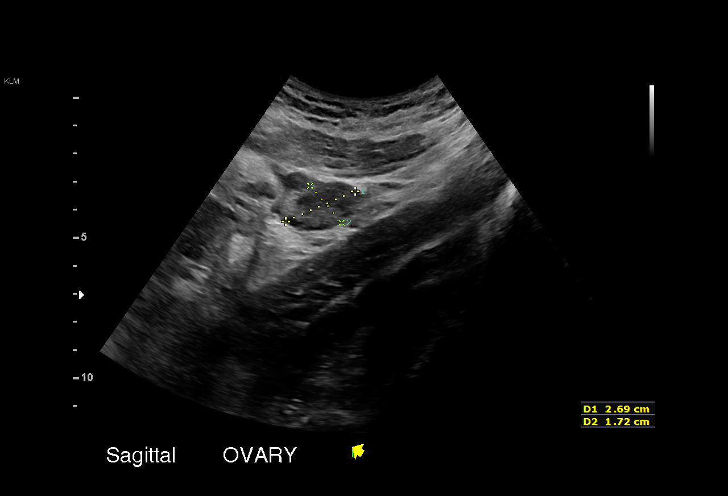
[im 13/36]
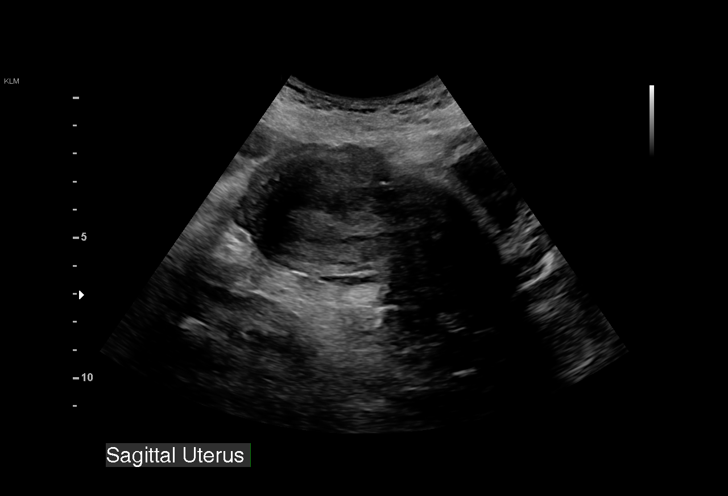
[im 16/36]
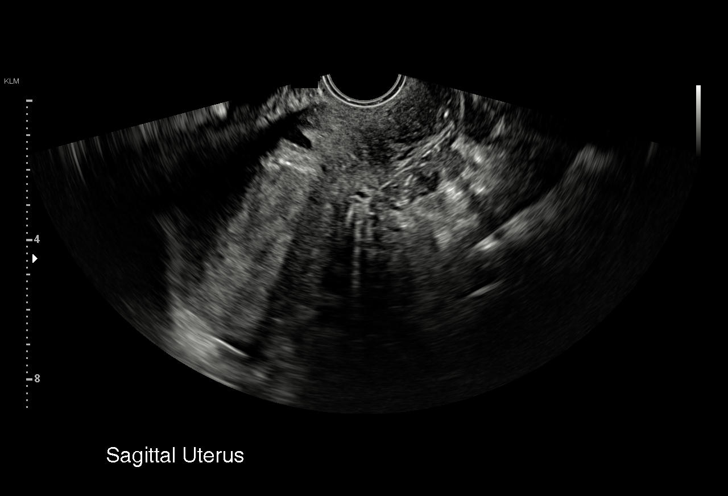
[im 19/36]
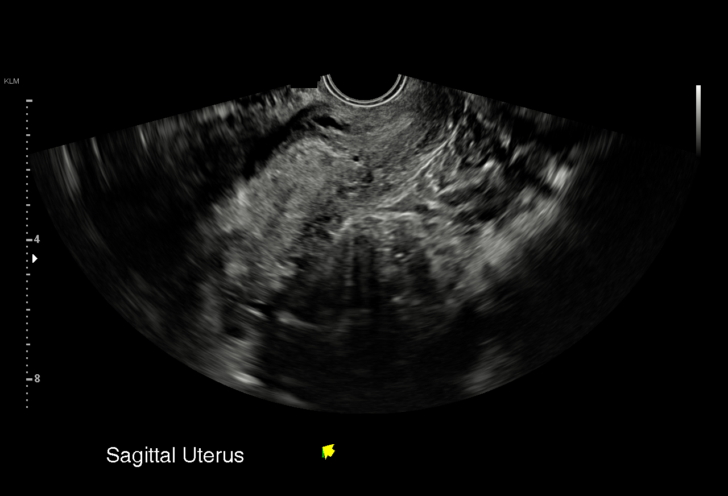
[im 20/36]
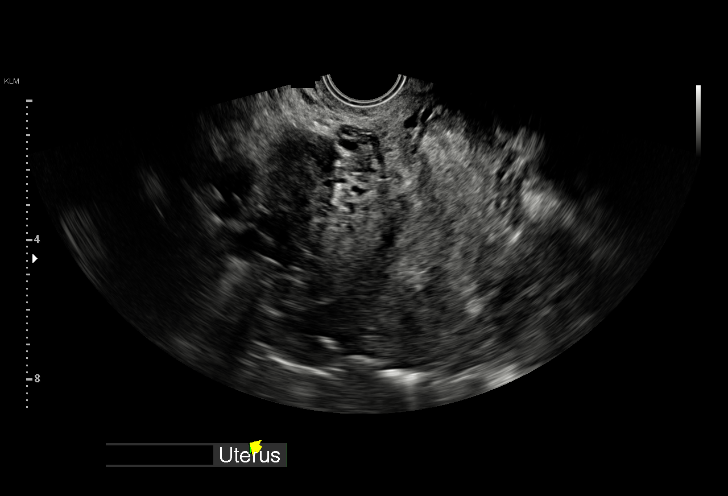
[im 23/36]
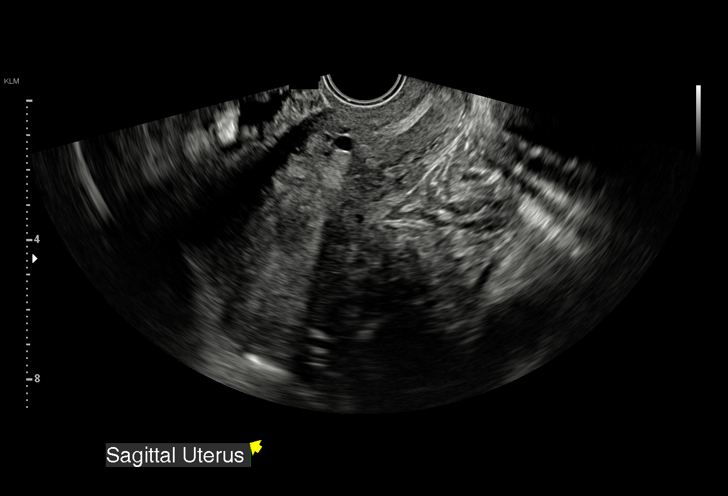
[im 25/36]
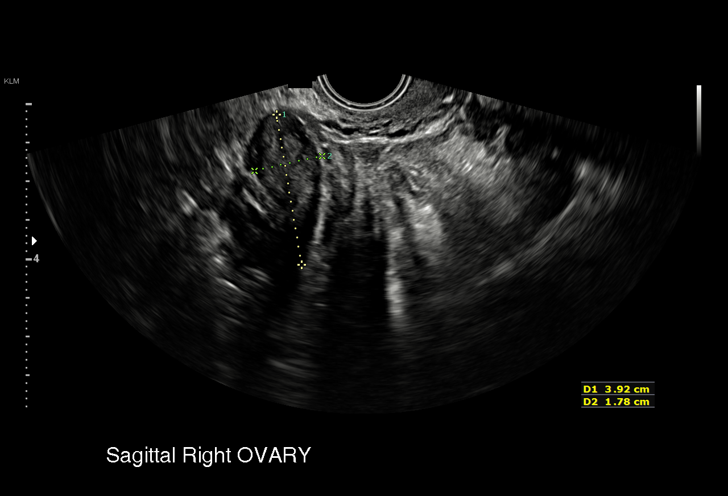
[im 28/36]
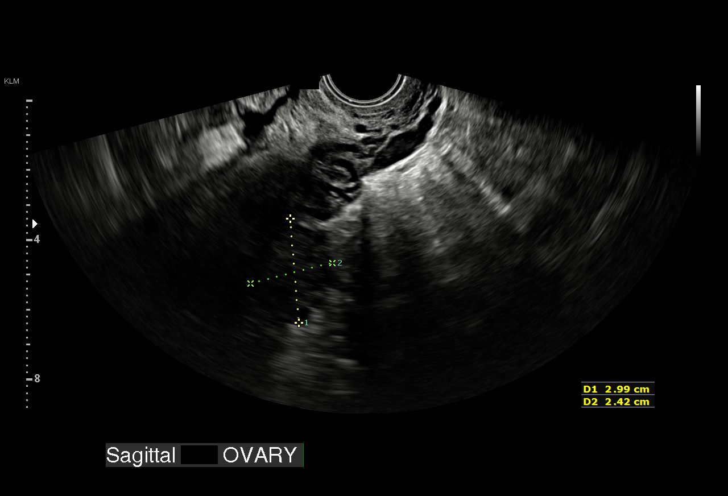
[im 30/36]
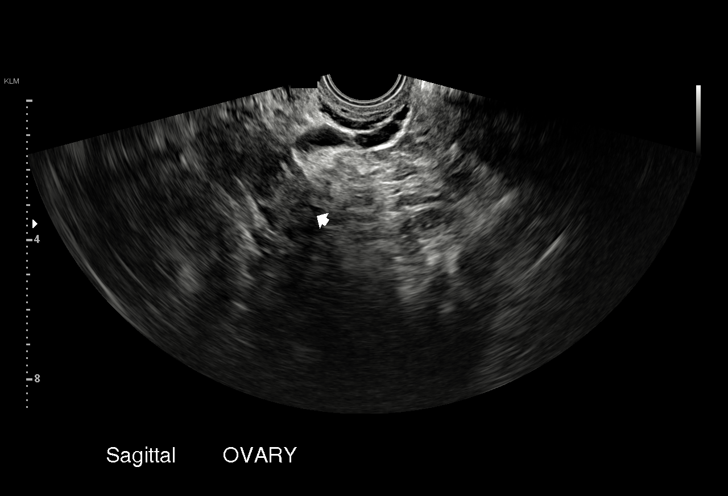
[im 33/36]
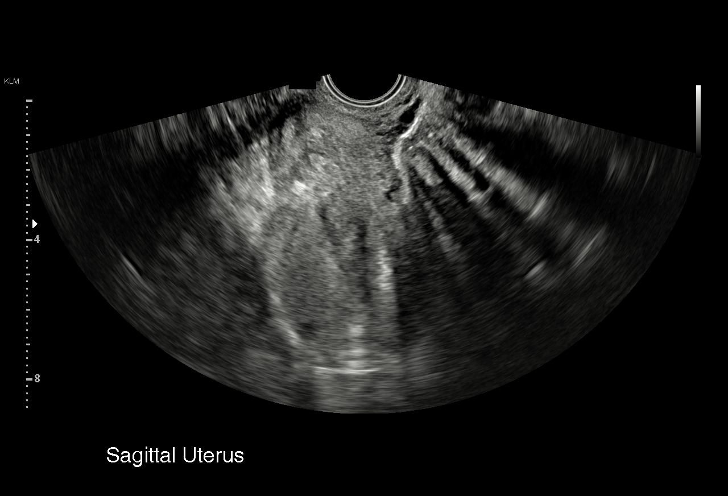
[im 36/36]
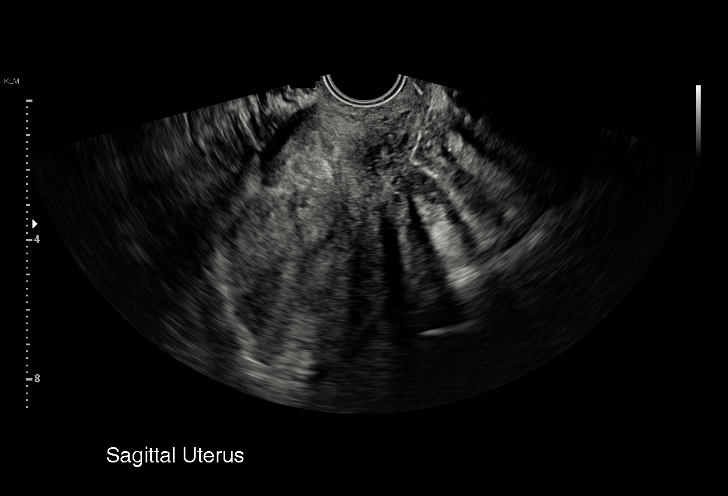

[15 of 28 positions shown; findings below may reference images not displayed]

FINDINGS: Intrauterine gestational sac: None

Yolk sac:  Not Visualized.

Embryo:  Not Visualized.

Cardiac Activity: Not applicable

Heart Rate: Not applicable

Subchorionic hemorrhage:  None visualized.

Maternal uterus/adnexae: Heterogeneous thickened endometrium.
Ovaries are unremarkable with the right measuring 3.1 x 1.4 x 1.9 cm
and the left measuring 2.6 x 1.7 x 2.3 cm.
IMPRESSION: No intrauterine or definite ectopic pregnancy.

## 2020-09-01 NOTE — Progress Notes (Signed)
Patient presents for colposcopy for LGSIL pap smear.  Her UPT is positive. Colposcopy is deferred until after 6 weeks postpartum.  Brock Bad, MD 09/01/2020 12:14 PM

## 2020-09-01 NOTE — Patient Instructions (Addendum)
Cervical Dysplasia  Cervical dysplasia is a condition in which a woman's cervix cells have abnormal changes. The cervix is the opening of the uterus (womb). It is located between the vagina and the uterus. Cervical dysplasia may be an early sign of cervical cancer. If left untreated, this condition may become more severe and may progress to cervical cancer. Early detection, treatment, and follow-up care are very important. What are the causes? Cervical dysplasia can be caused by a human papillomavirus (HPV) infection. HPV is the most common sexually transmitted infection (STI). HPV is spread from person to person through sexual contact. This includes oral, vaginal, or anal sex. What increases the risk? The following factors may make you more likely to develop this condition:  Having had a sexually transmitted infection (STI), such as herpes, chlamydia or HPV.  Becoming sexually active before age 18.  Having had more than one sexual partner.  Having a sexual partner who has multiple sexual partners.  Not using protection, such as a condom, during sex, especially with new sexual partners.  Having a history of cancer of the vagina or vulva.  Having a weakened body defense (immune) system.  Being the daughter of a woman who took diethylstilbestrol (DES) during pregnancy.  Having a family history of cervical cancer.  Smoking.  Using oral contraceptives, also called birth control pills.  Having had three or more full-term pregnancies. What are the signs or symptoms? There are usually no symptoms of this condition. If you do have symptoms, they may include:  Abnormal vaginal discharge.  Bleeding between periods or after sex.  Bleeding during menopause.  Pain during sex (dyspareunia). How is this diagnosed? A test called a Pap test may be done. During this test, cells are taken from the cervix and then examined under a microscope. A test in which tissue is removed from the cervix  (biopsy) may also be done if the Pap test is abnormal or if the cervix looks abnormal. How is this treated? Treatment varies based on the severity of the condition. Treatment may include:  Cryotherapy. During cryotherapy, the abnormal cells are frozen with a steel-tip instrument.  Loop electrosurgical excision procedure (LEEP). This procedure removes abnormal tissue from the cervix.  Surgery to remove abnormal tissue. This is usually done in more advanced cases. Surgical options include: ? A cone biopsy. This is a procedure in which the cervical canal and a portion of the center of the cervix are removed. ? Hysterectomy. This is a surgery in which the uterus and cervix are removed. Follow these instructions at home:  Take over-the-counter and prescription medicines only as told by your health care provider.  Do not use tampons, have sex, or douche until your health care provider says it is okay.  Keep follow-up visits as told by your health care provider. This is important. Women who have been treated for cervical dysplasia should have regular pelvic exams and Pap tests as told by their health care provider. How is this prevented?  Practice safe sex to help prevent sexually transmitted infections (STI) that may cause this condition.  Have regular Pap tests. Talk with your health care provider about how often you need these tests. Pap tests will help identify cell changes that can lead to cancer. Contact a health care provider if:  You develop genital warts. Get help right away if:  You have a fever.  You have abnormal vaginal discharge.  Your menstrual period is heavier than normal.  You develop bright red bleeding.   This may include blood clots.  You have pain or cramps that get worse, and medicine does not help to relieve your pain.  You feel light-headed and you are unusually weak.  You have fainting spells.  You have pain in the abdomen. Summary  Cervical dysplasia is  a condition in which a woman's cervix cells have abnormal changes.  If left untreated, this condition may become more severe and may progress to cervical cancer.  Early detection, treatment, and follow-up care are very important in managing this condition.  Have regular pelvic exams and Pap tests. Talk with your health care provider about how often you need these tests. Pap tests will help identify cell changes that can lead to cancer. This information is not intended to replace advice given to you by your health care provider. Make sure you discuss any questions you have with your health care provider. Document Revised: 07/01/2018 Document Reviewed: 09/12/2016 Elsevier Patient Education  2020 Elsevier Inc.  Human Papillomavirus Human papillomavirus (HPV) is the most common sexually transmitted infection (STI). It easily spreads from person to person (is very contagious). There are many types of HPV. HPV often does not cause symptoms. Sometimes it may cause genital warts that can be seen and felt. There may also be wart-like lesions in the throat. You can have HPV for a long time and not know it. You may spread HPV to others without knowing it. Certain types of HPV may cause cancer. What are the causes? HPV is caused by a virus that spreads from person to person through sex. What increases the risk? You may be more likely to get HPV if you have or have had:  Unprotected sex of any kind.  Several sex partners.  A sex partner who has other sex partners.  Another STI.  A weak disease-fighting system (immune system).  Damaged skin in the genital, oral, or anal area. What are the signs or symptoms? Most people who have HPV do not have any symptoms. If you have symptoms, they may include:  Wart-like lesions in the throat from having oral sex.  Warts on the infected areas.  Genital warts that may itch, burn, bleed, or be painful during sex. How is this treated? There is no treatment  for the virus itself. However, there are treatments for the health problems and symptoms HPV can cause. Your doctor may treat HPV by:  Giving medicines that are creams, lotions, liquids, or gels. These medicines may be injected into or put onto genital or anal warts.  Applying one of the following to the genital or anal warts: ? Extreme cold. ? An intense beam of light (laser treatment). ? Extreme heat.  Doing surgery to remove the genital or anal warts. Your doctor will monitor you closely after you are treated. HPV can come back and you may need treatment again. Follow these instructions at home: Medicines  Take over-the-counter and prescription medicines only as told by your doctor.  Do not treat genital warts with medicines that are meant for hand warts. General instructions  Do not touch or scratch the warts.  Do not have sex while you are being treated.  Do not douche or use tampons during treatment (for women).  Tell your sex partner about your infection. He or she may also need to be treated.  If you get pregnant, tell your doctor that you have HPV. Your doctor will monitor you during the pregnancy.  Keep all follow-up visits as told by your doctor. This  is important. How is this prevented?  Talk with your doctor about getting an HPV vaccine. This can prevent some HPV infections and related cancers. You may need 2-3 doses of the vaccine, depending on your age. ? The vaccine will not work if you already have HPV. ? The vaccine is not recommended for pregnant women.  After treatment, use condoms during sex. This helps to prevent future infections.  Have only one sex partner.  Have a sex partner who does not have other sex partners.  Get Pap tests as told by your doctor. Contact a doctor if:  The treated skin gets red, swollen, or painful.  You have a fever.  You feel ill.  You feel lumps or pimples in and around your genital or anal area.  You have bleeding  from the vagina.  You have bleeding from the area that was treated.  You have pain during sex. Summary  HPV is the most common STI. It easily spreads from person to person.  HPV often does not cause any symptoms.  HPV vaccine can prevent some HPV infections and related cancers.  There is no treatment for the virus itself. However, there are treatments for the health problems and symptoms HPV can cause. This information is not intended to replace advice given to you by your health care provider. Make sure you discuss any questions you have with your health care provider. Document Revised: 12/31/2018 Document Reviewed: 05/07/2018 Elsevier Patient Education  2020 Elsevier Inc.  Colposcopy Colposcopy is a procedure to examine the lowest part of the uterus (cervix), the vagina, and the area around the vaginal opening (vulva) for abnormalities or signs of disease. The procedure is done using a lighted microscope or magnifying lens (colposcope). If any unusual cells are found during the procedure, your health care provider may remove a tissue sample for testing (biopsy). A colposcopy may be done if you:  Have an abnormal Pap test. A Pap test is a screening test that is used to check for signs of cancer or infection of the vagina, cervix, and uterus.  Have a Pap smear test in which you test positive for high-risk HPV (human papillomavirus).  Have a sore or lesion on your cervix.  Have genital warts on your vulva, vagina, or cervix.  Took certain medicines while pregnant, such as diethylstilbestrol (DES).  Have pain during sexual intercourse.  Have vaginal bleeding, especially after sexual intercourse.  Need to have a cervical polyp removed.  Need to have a lost intrauterine device (IUD) string located. Let your health care provider know about:  Any allergies you have, including allergies to prescribed medicine, latex, or iodine.  All medicines you are taking, including vitamins,  herbs, eye drops, creams, and over-the-counter medicines. Bring a list of all of your medicines to your appointment.  Any problems you or family members have had with anesthetic medicines.  Any blood disorders you have.  Any surgeries you have had.  Any medical conditions you have, such as pelvic inflammatory disease (PID) or endometrial disorder.  Any history of frequent fainting.  Your menstrual cycle and what form of birth control (contraception) you use.  Your medical history, including any prior cervical treatment.  Whether you are pregnant or may be pregnant. What are the risks? Generally, this is a safe procedure. However, problems may occur, including:  Pain.  Infection, which may include a fever, bad-smelling discharge, or pelvic pain.  Bleeding or discharge.  Misdiagnosis.  Fainting and vasovagal reactions, but this is  rare.  Allergic reactions to medicines.  Damage to other structures or organs. What happens before the procedure?  If you have your menstrual period or will have it at the time of your procedure, tell your health care provider. A colposcopy typically is not done during menstruation.  Continue your contraceptive practices before and after the procedure.  For 24 hours before the colposcopy: ? Do not douche. ? Do not use tampons. ? Do not use medicines, creams, or suppositories in the vagina. ? Do not have sexual intercourse.  Ask your health care provider about: ? Changing or stopping your regular medicines. This is especially important if you are taking diabetes medicines or blood thinners. ? Taking medicines such as aspirin and ibuprofen. These medicines can thin your blood. Do not take these medicines before your procedure if your health care provider instructs you not to. It is likely that your health care provider will tell you to avoid taking aspirin or medicine that contains aspirin for 7 days before the procedure.  Follow instructions  from your health care provider about eating or drinking restrictions. You will likely need to eat a regular diet the day of the procedure and not skip any meals.  You may have an exam or testing. A pregnancy test will be taken on the day of the procedure.  You may have a blood or urine sample taken.  Plan to have someone take you home from the hospital or clinic.  If you will be going home right after the procedure, plan to have someone with you for 24 hours. What happens during the procedure?  You will lie down on your back, with your feet in foot rests (stirrups).  A warmed and lubricated instrument (speculum) will be inserted into your vagina. The speculum will be used to hold apart the walls of your vagina so your health care provider can see your cervix and the inside of your vagina.  A cotton swab will be used to place a small amount of liquid solution on the areas to be examined. This solution makes it easier to see abnormal cells. You may feel a slight burning during this part.  The colposcope will be used to scan the cervix with a bright white light. The colposcope will be held near your vulvaand will magnify your vulva, vagina, and cervix for easier examination.  Your health care provider may decide to take a biopsy. If so: ? You may be given medicine to numb the area (local anesthetic). ? Surgical instruments will be used to suck out mucus and cells through your vagina. ? You may feel mild pain while the tissue sample is removed. ? Bleeding may occur. A solution may be used to stop the bleeding. ? If a sample of tissue is needed from the inside of the cervix, a different procedure called endocervical curettage (ECC) may be completed. During this procedure, a curved instrument (curette) will be used to scrape cells from your cervix or the top of your cervix (endocervix).  Your health care provider will record the location of any abnormalities. The procedure may vary among health  care providers and hospitals. What happens after the procedure?  You will lie down and rest for a few minutes. You may be offered juice or cookies.  Your blood pressure, heart rate, breathing rate, and blood oxygen level will be monitored until any medicines you were given have worn off.  You may have to wear compression stockings. These stockings help to prevent blood  clots and reduce swelling in your legs.  You may have some cramping in your abdomen. This should go away after a few minutes. This information is not intended to replace advice given to you by your health care provider. Make sure you discuss any questions you have with your health care provider. Document Revised: 08/22/2017 Document Reviewed: 04/15/2016 Elsevier Patient Education  2020 [employer].

## 2020-12-12 DIAGNOSIS — F99 Mental disorder, not otherwise specified: Secondary | ICD-10-CM | POA: Diagnosis not present

## 2020-12-13 DIAGNOSIS — F99 Mental disorder, not otherwise specified: Secondary | ICD-10-CM | POA: Diagnosis not present

## 2021-04-11 ENCOUNTER — Other Ambulatory Visit: Payer: Self-pay

## 2021-04-11 ENCOUNTER — Other Ambulatory Visit (HOSPITAL_COMMUNITY)
Admission: RE | Admit: 2021-04-11 | Discharge: 2021-04-11 | Disposition: A | Discharge status: Home / Self Care | Payer: Medicaid Other | Source: Ambulatory Visit | Attending: Obstetrics | Admitting: Obstetrics

## 2021-04-11 ENCOUNTER — Ambulatory Visit (INDEPENDENT_AMBULATORY_CARE_PROVIDER_SITE_OTHER): Payer: Medicaid Other | Admitting: Obstetrics

## 2021-04-11 ENCOUNTER — Encounter: Payer: Self-pay | Admitting: Obstetrics

## 2021-04-11 VITALS — BP 115/68 | HR 64 | Ht 64.0 in | Wt 156.1 lb

## 2021-04-11 DIAGNOSIS — R87612 Low grade squamous intraepithelial lesion on cytologic smear of cervix (LGSIL): Secondary | ICD-10-CM | POA: Diagnosis not present

## 2021-04-11 DIAGNOSIS — Z30013 Encounter for initial prescription of injectable contraceptive: Secondary | ICD-10-CM

## 2021-04-11 DIAGNOSIS — Z113 Encounter for screening for infections with a predominantly sexual mode of transmission: Secondary | ICD-10-CM | POA: Insufficient documentation

## 2021-04-11 DIAGNOSIS — N870 Mild cervical dysplasia: Secondary | ICD-10-CM | POA: Diagnosis not present

## 2021-04-11 DIAGNOSIS — Z01812 Encounter for preprocedural laboratory examination: Secondary | ICD-10-CM

## 2021-04-11 LAB — POCT URINE PREGNANCY: Preg Test, Ur: NEGATIVE

## 2021-04-11 MED ORDER — MEDROXYPROGESTERONE ACETATE 150 MG/ML IM SUSP
150.0000 mg | Freq: Once | INTRAMUSCULAR | Status: AC
  Administered 2021-04-11: 150 mg via INTRAMUSCULAR

## 2021-04-11 MED ORDER — MEDROXYPROGESTERONE ACETATE 150 MG/ML IM SUSP: 150.0000 mg | INTRAMUSCULAR | 4 refills | Status: DC

## 2021-04-11 NOTE — Progress Notes (Signed)
Pt presents for Colpo.  Last pap: 08/11/20 [facility]

## 2021-04-11 NOTE — Progress Notes (Signed)
Colposcopy Procedure Note  Indications: Pap smear 6 months ago showed: low-grade squamous intraepithelial neoplasia (LGSIL - encompassing HPV,mild dysplasia,CIN I). The prior pap showed no abnormalities.  Prior cervical/vaginal disease: normal exam without visible pathology. Prior cervical treatment: no treatment.  Procedure Details  The risks and benefits of the procedure and Written informed consent obtained.  A time-out was performed confirming the patient, procedure and allergy status  Speculum placed in vagina and excellent visualization of cervix achieved, cervix swabbed x 3 with acetic acid solution.  Findings: Cervix: no visible lesions, no mosaicism, no punctation, and no abnormal vasculature; SCJ visualized 360 degrees without lesions, endocervical curettage performed, cervical biopsies taken at 6 and 12 o'clock, specimen labelled and sent to pathology, and hemostasis achieved with silver nitrate.   Vaginal inspection: normal without visible lesions. Vulvar colposcopy: vulvar colposcopy not performed.   Physical Exam   Specimens: ECC and Cervical Biopsies  Complications: none.  Plan: Specimens labelled and sent to Pathology. Will base further treatment on Pathology findings. Treatment options discussed with patient. Post biopsy instructions given to patient. Return to discuss Pathology results in 2 weeks.   Brock Bad, MD 04/11/2021 3:15 PM

## 2021-04-11 NOTE — Progress Notes (Signed)
Pt wants std testing and to discuss getting back on depo.

## 2021-04-12 LAB — CERVICOVAGINAL ANCILLARY ONLY
Bacterial Vaginitis (gardnerella): POSITIVE — AB
Candida Glabrata: NEGATIVE
Candida Vaginitis: POSITIVE — AB
Chlamydia: NEGATIVE
Comment: NEGATIVE
Comment: NEGATIVE
Comment: NEGATIVE
Comment: NEGATIVE
Comment: NEGATIVE
Comment: NORMAL
Neisseria Gonorrhea: NEGATIVE
Trichomonas: NEGATIVE

## 2021-04-16 ENCOUNTER — Other Ambulatory Visit: Payer: Self-pay | Admitting: Obstetrics

## 2021-04-16 DIAGNOSIS — B379 Candidiasis, unspecified: Secondary | ICD-10-CM

## 2021-04-16 LAB — SURGICAL PATHOLOGY

## 2021-04-16 MED ORDER — FLUCONAZOLE 150 MG PO TABS: 150.0000 mg | ORAL_TABLET | Freq: Once | ORAL | 0 refills | Status: AC

## 2021-04-17 ENCOUNTER — Other Ambulatory Visit: Payer: Self-pay | Admitting: Obstetrics

## 2021-04-25 ENCOUNTER — Telehealth (INDEPENDENT_AMBULATORY_CARE_PROVIDER_SITE_OTHER): Payer: Medicaid Other | Admitting: Obstetrics

## 2021-04-25 DIAGNOSIS — N870 Mild cervical dysplasia: Secondary | ICD-10-CM

## 2021-04-25 DIAGNOSIS — B373 Candidiasis of vulva and vagina: Secondary | ICD-10-CM

## 2021-04-25 DIAGNOSIS — B9689 Other specified bacterial agents as the cause of diseases classified elsewhere: Secondary | ICD-10-CM

## 2021-04-25 DIAGNOSIS — N760 Acute vaginitis: Secondary | ICD-10-CM

## 2021-04-25 DIAGNOSIS — B3731 Acute candidiasis of vulva and vagina: Secondary | ICD-10-CM

## 2021-04-25 MED ORDER — METRONIDAZOLE 500 MG PO TABS: 500.0000 mg | ORAL_TABLET | Freq: Two times a day (BID) | ORAL | 2 refills | Status: DC

## 2021-04-25 NOTE — Progress Notes (Signed)
GYNECOLOGY VIRTUAL VISIT ENCOUNTER NOTE  [facility]   Patient location: Home  I connected with Rachel Aguirre on 04/25/21 at  2:50 PM EDT by MyChart Video Encounter and verified that I am speaking with the correct person using two identifiers.   I discussed the limitations, risks, security and privacy concerns of performing an evaluation and management service virtually and the availability of in person appointments. I also discussed with the patient that there may be a patient responsible charge related to this service. The patient expressed understanding and agreed to proceed.   History:  Rachel Aguirre is a 25 y.o. [phone] female being evaluated today for results of colposcopic biopsies for LGSIL pap. She denies any abnormal vaginal discharge, bleeding, pelvic pain or other concerns.       Past Medical History:  Diagnosis Date   Infection    UTI   PONV (postoperative nausea and vomiting)    after C section   Past Surgical History:  Procedure Laterality Date   CESAREAN SECTION N/A 03/27/2015   Procedure: CESAREAN SECTION;  Surgeon: Brock Bad, MD;  Location: [facility];  Service: Obstetrics;  Laterality: N/A;   CESAREAN SECTION N/A 04/08/2016   Procedure: CESAREAN SECTION;  Surgeon: Slocomb Bing, MD;  Location: [facility] BIRTHING SUITES;  Service: Obstetrics;  Laterality: N/A;   CHOLECYSTECTOMY N/A 01/06/2018   Procedure: LAPAROSCOPIC CHOLECYSTECTOMY WITH INTRAOPERATIVE CHOLANGIOGRAM;  Surgeon: Manus Rudd, MD;  Location: [facility];  Service: General;  Laterality: N/A;   WISDOM TOOTH EXTRACTION     The following portions of the patient's history were reviewed and updated as appropriate: allergies, current medications, past family history, past medical history, past social history, past surgical history and problem list.    Review of Systems:  Pertinent items noted in HPI and remainder of comprehensive ROS otherwise  negative.  Physical Exam:   General:  Alert, oriented and cooperative. Patient appears to be in no acute distress.  Mental Status: Normal mood and affect. Normal behavior. Normal judgment and thought content.   Respiratory: Normal respiratory effort, no problems with respiration noted  Rest of physical exam deferred due to type of encounter  Labs and Imaging Results for orders placed or performed in visit on 04/11/21 (from the past 336 hour(s))  POCT urine pregnancy   Collection Time: 04/11/21  3:43 PM  Result Value Ref Range   Preg Test, Ur Negative Negative   No results found.     Assessment and Plan:       1. 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  Discussed results and management recommendations  - repeat pap in 1 year  2. BV (bacterial vaginosis) Rx: - metroNIDAZOLE (FLAGYL) 500 MG tablet; Take 1 tablet (500 mg total) by mouth 2 (two) times daily.  Dispense: 14 tablet; Refill: 2  3. Candida vaginitis - Diflucan 150 mg po once     I discussed the assessment and treatment plan with the patient. The patient was provided an opportunity to ask questions and all were answered. The patient agreed with the plan and demonstrated an understanding of the instructions.   The patient was advised to call back or seek an in-person evaluation/go to the ED if the symptoms worsen or if the condition fails to improve as anticipated.  I have spent a total of 15 minutes of non-face-to-face time, excluding clinical staff time, reviewing notes and preparing to see patient, ordering tests and/or medications, and counseling the  patient.    Coral Ceo, MD [facility], [facility]  .04/25/21

## 2021-04-25 NOTE — Progress Notes (Signed)
Virtual Visit via Telephone Note  I connected with Rachel Aguirre on 04/25/21 at  2:50 PM EDT by telephone and verified that I am speaking with the correct person using two identifiers.  Follow up s/p colposcopy on 04/11/21.

## 2021-07-04 ENCOUNTER — Other Ambulatory Visit: Payer: Self-pay

## 2021-07-04 ENCOUNTER — Ambulatory Visit (INDEPENDENT_AMBULATORY_CARE_PROVIDER_SITE_OTHER): Payer: Medicaid Other

## 2021-07-04 DIAGNOSIS — Z3042 Encounter for surveillance of injectable contraceptive: Secondary | ICD-10-CM

## 2021-07-04 LAB — POCT URINE PREGNANCY: Preg Test, Ur: NEGATIVE

## 2021-07-04 NOTE — Progress Notes (Addendum)
Pt is in the office for depo injection, administered in L Del and pt tolerated well. UPT is negative today. Next Due Dec 28- Jan 11 .Marland Kitchen Administrations This Visit     medroxyPROGESTERone (DEPO-PROVERA) injection 150 mg     Admin Date 07/04/2021 Action Given Dose 150 mg Route Intramuscular Administered By Katrina Stack, RN           Patient was assessed and managed by nursing staff during this encounter. I have reviewed the chart and agree with the documentation and plan. I have also made any necessary editorial changes.  Coral Ceo, MD 07/04/2021 5:35 PM

## 2021-09-05 DIAGNOSIS — N898 Other specified noninflammatory disorders of vagina: Secondary | ICD-10-CM | POA: Diagnosis not present

## 2021-09-06 DIAGNOSIS — N898 Other specified noninflammatory disorders of vagina: Secondary | ICD-10-CM | POA: Diagnosis not present

## 2021-09-26 ENCOUNTER — Ambulatory Visit: Payer: Medicaid Other

## 2021-10-10 ENCOUNTER — Ambulatory Visit (INDEPENDENT_AMBULATORY_CARE_PROVIDER_SITE_OTHER): Payer: Medicaid Other | Admitting: *Deleted

## 2021-10-10 ENCOUNTER — Other Ambulatory Visit: Payer: Self-pay

## 2021-10-10 DIAGNOSIS — Z3042 Encounter for surveillance of injectable contraceptive: Secondary | ICD-10-CM

## 2021-10-10 MED ORDER — MEDROXYPROGESTERONE ACETATE 150 MG/ML IM SUSP
150.0000 mg | Freq: Once | INTRAMUSCULAR | Status: AC
  Administered 2021-10-10: 150 mg via INTRAMUSCULAR

## 2021-10-10 NOTE — Progress Notes (Signed)
Date last pap: 08/11/20. Last Depo-Provera: 07/04/21. Side Effects if any: NA. Serum HCG indicated? NA. (1 week out of window, no unprotected sex 2 weeks) Depo-Provera 150 mg IM given by: Selena Batten. Mechel Schutter, RNC in Right deltoid. Next appointment due 12/26/21-01/09/22.

## 2021-12-14 IMAGING — CT CT MAXILLOFACIAL W/ CM
3 of 4 series · 16 of 47 positions shown, 19 images · IV contrast (omnipaque)
Comparison: None.

CLINICAL DATA: Left dental pain and facial swelling.

EXAM:
CT MAXILLOFACIAL WITH CONTRAST
TECHNIQUE: Multidetector CT imaging of the maxillofacial structures was
performed with intravenous contrast. Multiplanar CT image
reconstructions were also generated.
CONTRAST:  100mL OMNIPAQUE IOHEXOL 300 MG/ML  SOLN

[Series 3: max soft · axial · 0.36mm/px · z∈[+1226,+1368]mm · 11 of 83 slices shown, 14 images]
[im 6/83  brain]
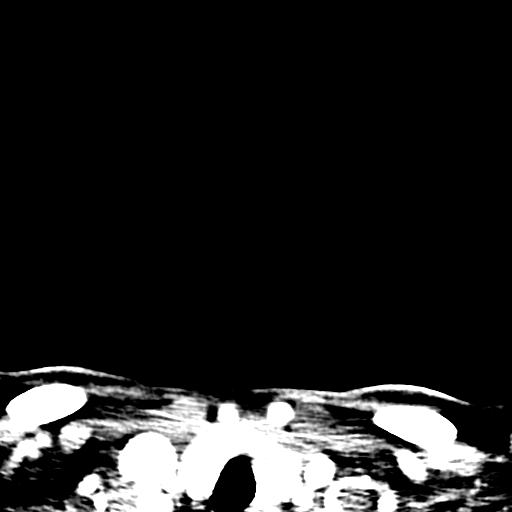
[im 6/83  bone]
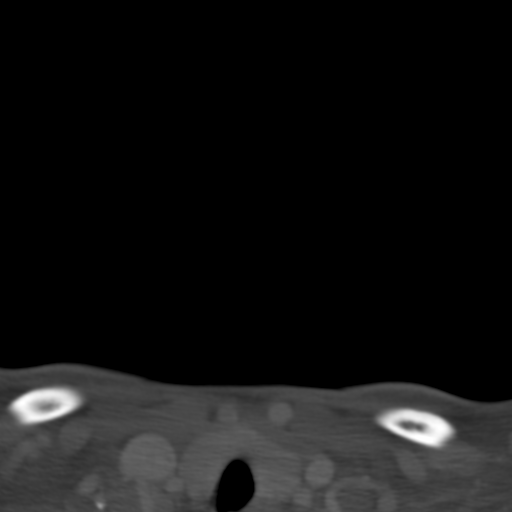
[im 12/83  bone]
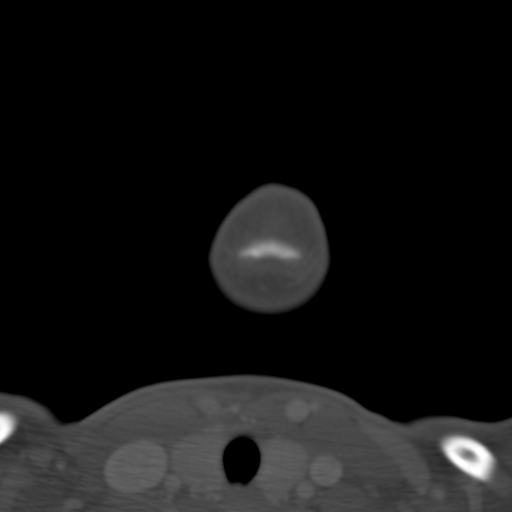
[im 20/83  bone]
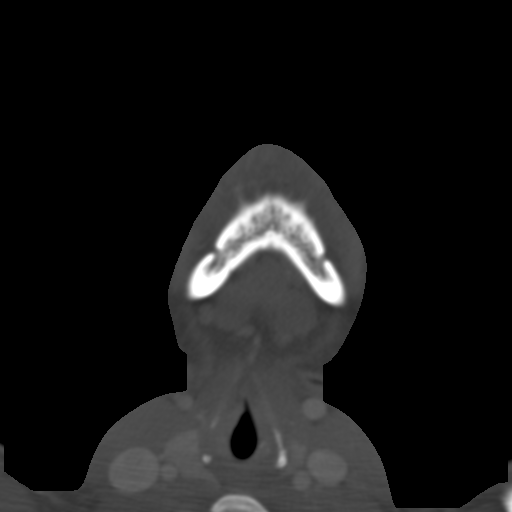
[im 26/83  bone]
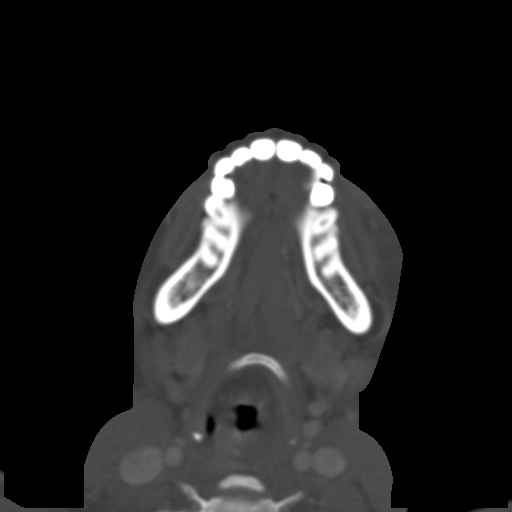
[im 34/83  brain]
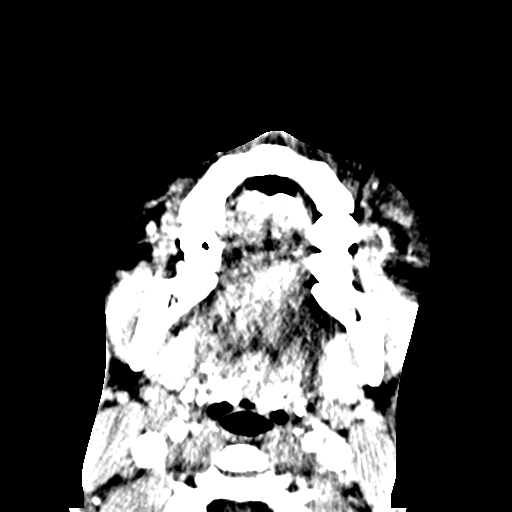
[im 34/83  bone]
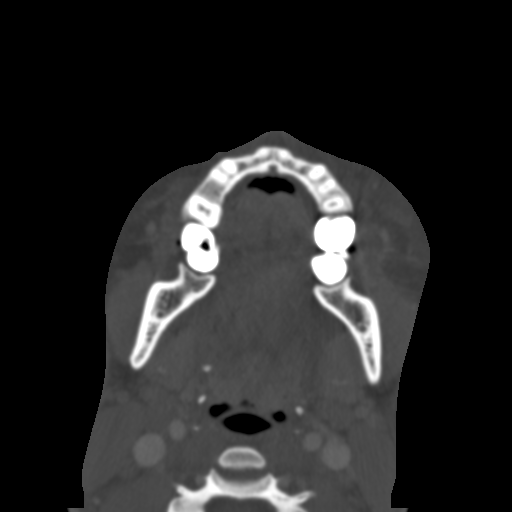
[im 43/83  bone]
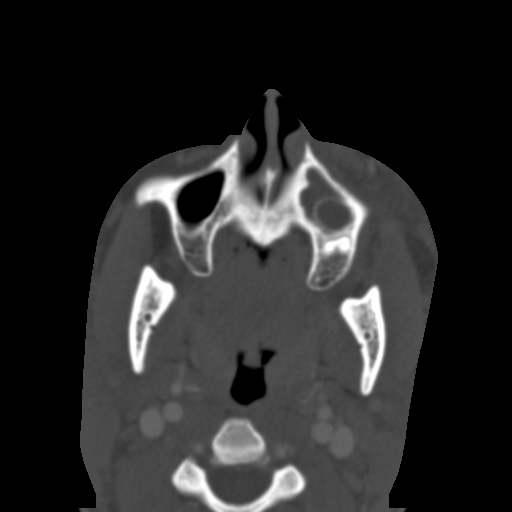
[im 49/83  bone]
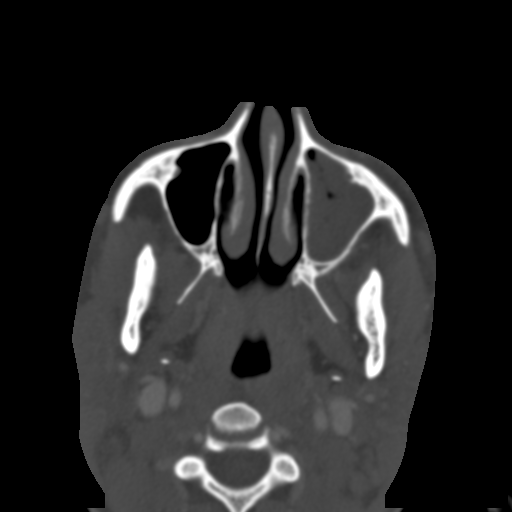
[im 57/83  bone]
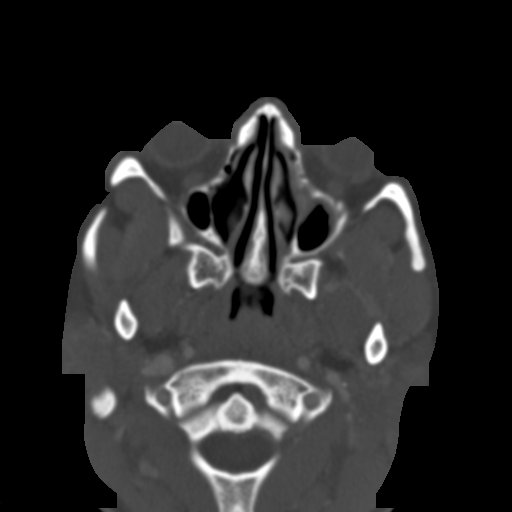
[im 63/83  brain]
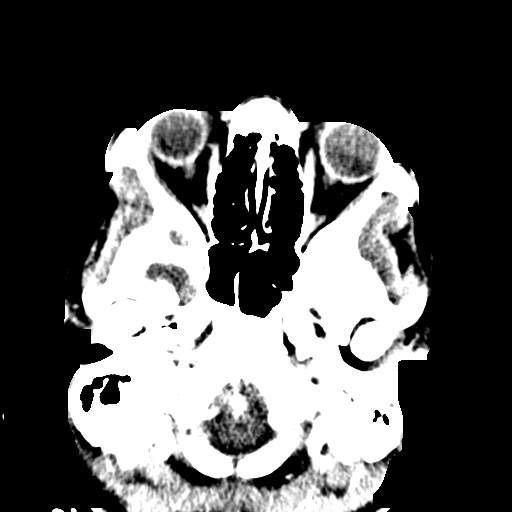
[im 63/83  bone]
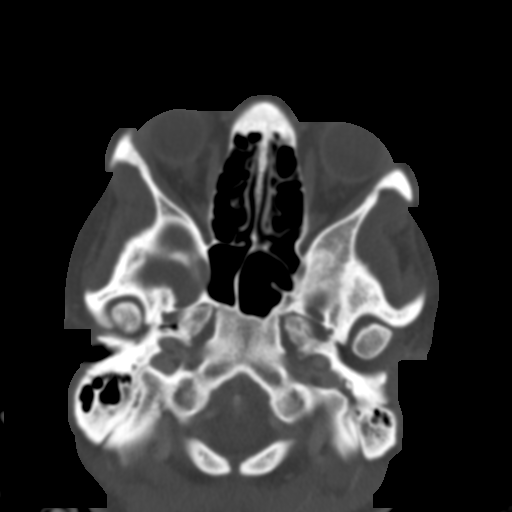
[im 71/83  bone]
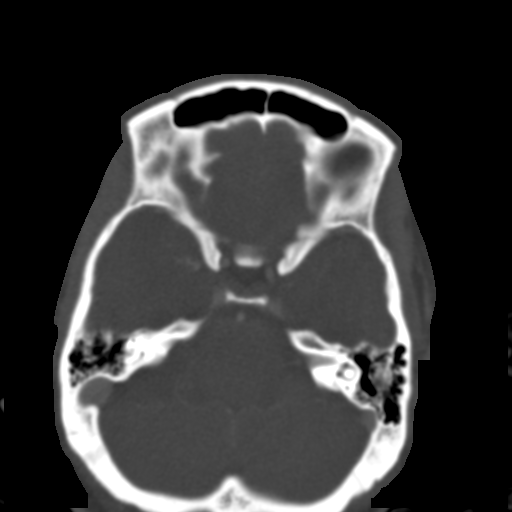
[im 77/83  bone]
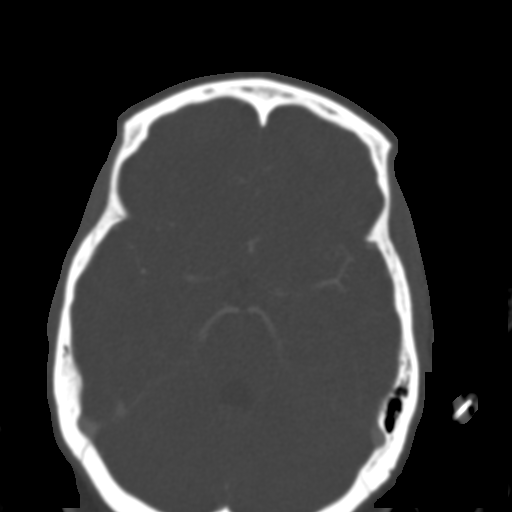

[Series 5: coronal soft · coronal · 0.36mm/px · 3 of 86 slices shown]
[im 29/86  bone]
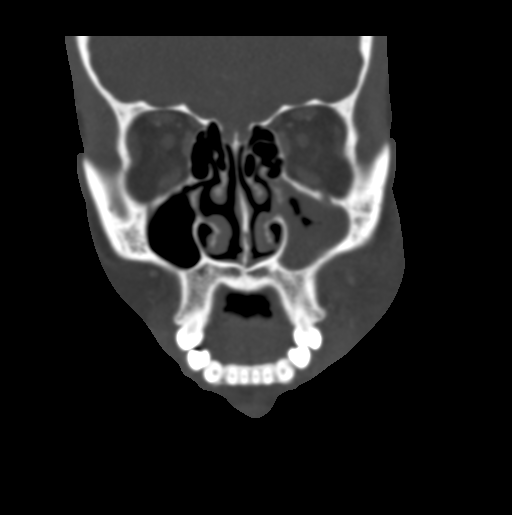
[im 38/86  bone]
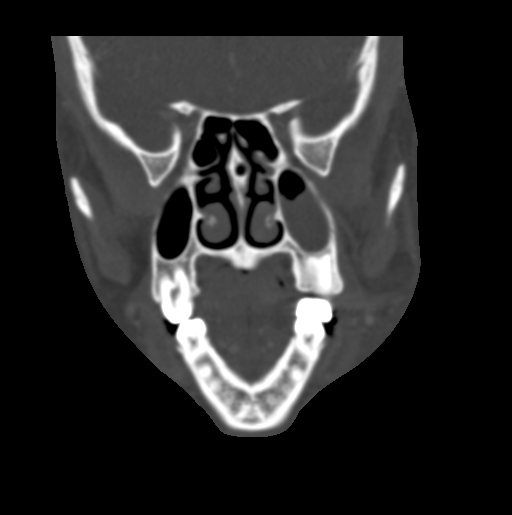
[im 48/86  bone]
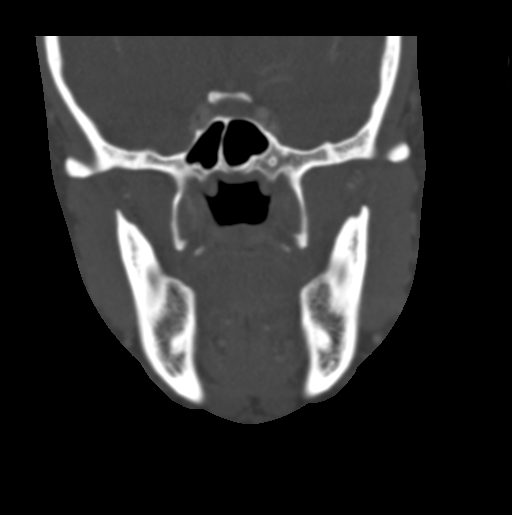

[Series 8: sagittal bone · sagittal · 0.40mm/px · 2 of 80 slices shown]
[im 27/80  bone]
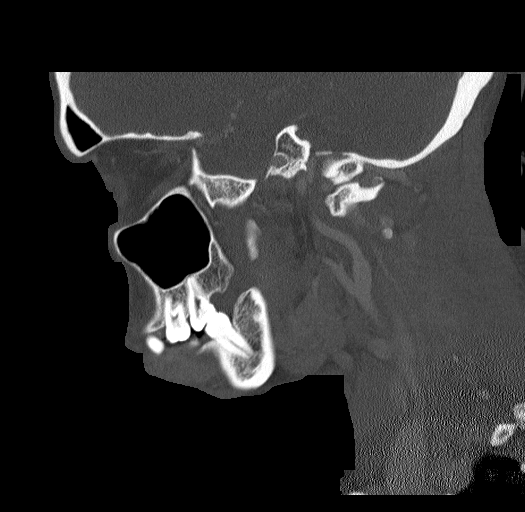
[im 53/80  bone]
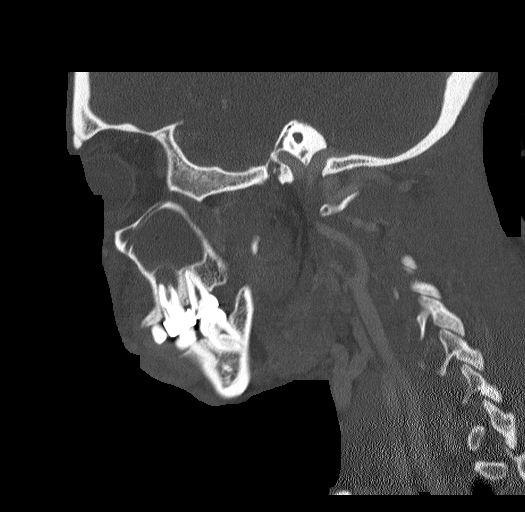

[16 of 47 positions shown; findings below may reference images not displayed]

FINDINGS: Osseous: Previous root canal and crown of left maxillary tooth
fourteen. Large periapical cyst bulging into the left maxillary
sinus. Dental decay a of right maxillary tooth 2.

Orbits: Normal

Sinuses: Mucosal thickening of the left maxillary sinus, likely
odontogenic.

Soft tissues: Nonspecific mild left facial superficial swelling
consistent with cellulitis. No evidence of regional abscess. Parotid
and submandibular glands are normal. Other soft tissues are
unremarkable.

Limited intracranial: Normal
IMPRESSION: Previous root canal and crown of left maxillary tooth 14. Large
periapical cyst at that level bulging into the left maxillary sinus
with odontogenic inflammatory disease of the left maxillary sinus.
Left facial cellulitis without evidence of drainable abscess.

Dental decay right maxillary tooth 2.

## 2022-01-02 ENCOUNTER — Ambulatory Visit (INDEPENDENT_AMBULATORY_CARE_PROVIDER_SITE_OTHER): Payer: Medicaid Other

## 2022-01-02 DIAGNOSIS — Z3042 Encounter for surveillance of injectable contraceptive: Secondary | ICD-10-CM | POA: Diagnosis not present

## 2022-01-02 MED ORDER — MEDROXYPROGESTERONE ACETATE 150 MG/ML IM SUSP
150.0000 mg | Freq: Once | INTRAMUSCULAR | Status: AC
  Administered 2022-01-02: 150 mg via INTRAMUSCULAR

## 2022-01-02 NOTE — Progress Notes (Cosign Needed)
Patient presents for depo injection. Patient is within her window. Injection given in LD. Patient tolerated well. Next depo due 6/28-7/11. ?

## 2022-03-29 ENCOUNTER — Ambulatory Visit (INDEPENDENT_AMBULATORY_CARE_PROVIDER_SITE_OTHER): Payer: Medicaid Other | Admitting: Emergency Medicine

## 2022-03-29 VITALS — BP 123/76 | HR 62 | Wt 191.2 lb

## 2022-03-29 DIAGNOSIS — Z3042 Encounter for surveillance of injectable contraceptive: Secondary | ICD-10-CM

## 2022-03-29 MED ORDER — MEDROXYPROGESTERONE ACETATE 150 MG/ML IM SUSP
150.0000 mg | Freq: Once | INTRAMUSCULAR | Status: AC
  Administered 2022-03-29: 150 mg via INTRAMUSCULAR

## 2022-03-29 NOTE — Progress Notes (Signed)
Date last pap: 08/11/2020. Last Depo-Provera: 01/02/2022. Side Effects if any: none Serum HCG indicated? NA. Depo-Provera 150 mg IM given by: Leavy Cella, RN given into RD, tolerated well. Next appointment due 9/22- Oct 6.

## 2022-04-16 NOTE — Progress Notes (Signed)
Patient was assessed and managed by nursing staff during this encounter. I have reviewed the chart and agree with the documentation and plan. I have also made any necessary editorial changes.  Scheryl Darter, MD 04/16/2022 3:03 PM

## 2022-06-14 ENCOUNTER — Ambulatory Visit (INDEPENDENT_AMBULATORY_CARE_PROVIDER_SITE_OTHER): Payer: Medicaid Other | Admitting: Obstetrics and Gynecology

## 2022-06-14 ENCOUNTER — Other Ambulatory Visit (HOSPITAL_COMMUNITY)
Admission: RE | Admit: 2022-06-14 | Discharge: 2022-06-14 | Disposition: A | Discharge status: Home / Self Care | Payer: Medicaid Other | Source: Ambulatory Visit | Attending: Obstetrics and Gynecology | Admitting: Obstetrics and Gynecology

## 2022-06-14 ENCOUNTER — Encounter: Payer: Self-pay | Admitting: Obstetrics and Gynecology

## 2022-06-14 VITALS — BP 134/83 | HR 79 | Ht 64.0 in | Wt 196.0 lb

## 2022-06-14 DIAGNOSIS — Z3042 Encounter for surveillance of injectable contraceptive: Secondary | ICD-10-CM

## 2022-06-14 DIAGNOSIS — Z01419 Encounter for gynecological examination (general) (routine) without abnormal findings: Secondary | ICD-10-CM | POA: Diagnosis not present

## 2022-06-14 DIAGNOSIS — Z113 Encounter for screening for infections with a predominantly sexual mode of transmission: Secondary | ICD-10-CM | POA: Diagnosis not present

## 2022-06-14 MED ORDER — MEDROXYPROGESTERONE ACETATE 150 MG/ML IM SUSP: 150.0000 mg | INTRAMUSCULAR | 0 refills | Status: AC

## 2022-06-14 MED ORDER — MEDROXYPROGESTERONE ACETATE 150 MG/ML IM SUSP
150.0000 mg | Freq: Once | INTRAMUSCULAR | Status: AC
  Administered 2022-06-14: 150 mg via INTRAMUSCULAR

## 2022-06-14 MED ORDER — MEDROXYPROGESTERONE ACETATE 150 MG/ML IM SUSP: 150.0000 mg | INTRAMUSCULAR | 4 refills | Status: AC

## 2022-06-14 NOTE — Progress Notes (Signed)
Pt presents for annual, pap, and all STD's. Pt is on schedule for Depo. Depo given L Del without difficulty. Administrations This Visit     medroxyPROGESTERone (DEPO-PROVERA) injection 150 mg     Admin Date 06/14/2022 Action Given Dose 150 mg Route Intramuscular Administered By Hollice Gong, CMA

## 2022-06-14 NOTE — Progress Notes (Signed)
Subjective:     Rachel Aguirre is a 26 y.o. female P2 with amenorrhea secondary to depo-provera and BMI 33 who is here for a comprehensive physical exam. The patient reports no problems. Patient is sexually active without complaints. She denies pelvic pain or abnormal discharge. She denies any urinary symptoms. Patient is due for depo-provera today  Past Medical History:  Diagnosis Date   Infection    UTI   PONV (postoperative nausea and vomiting)    after C section   Past Surgical History:  Procedure Laterality Date   CESAREAN SECTION N/A 03/27/2015   Procedure: CESAREAN SECTION;  Surgeon: Brock Bad, MD;  Location: [facility];  Service: Obstetrics;  Laterality: N/A;   CESAREAN SECTION N/A 04/08/2016   Procedure: CESAREAN SECTION;  Surgeon: Toughkenamon Bing, MD;  Location: [facility] BIRTHING SUITES;  Service: Obstetrics;  Laterality: N/A;   CHOLECYSTECTOMY N/A 01/06/2018   Procedure: LAPAROSCOPIC CHOLECYSTECTOMY WITH INTRAOPERATIVE CHOLANGIOGRAM;  Surgeon: Manus Rudd, MD;  Location: [facility];  Service: General;  Laterality: N/A;   WISDOM TOOTH EXTRACTION     Family History  Problem Relation Age of Onset   Hypertension Mother      Social History   Socioeconomic History   Marital status: Single    Spouse name: Not on file   Number of children: Not on file   Years of education: Not on file   Highest education level: Not on file  Occupational History   Not on file  Tobacco Use   Smoking status: Never   Smokeless tobacco: Never  Substance and Sexual Activity   Alcohol use: No   Drug use: No   Sexual activity: Yes    Birth control/protection: None  Other Topics Concern   Not on file  Social History Narrative   Not on file   Social Determinants of Health   Financial Resource Strain: Not on file  Food Insecurity: Not on file  Transportation Needs: Not on file  Physical Activity: Not on file  Stress: Not on file  Social Connections: Not on file  Intimate Partner Violence: Not  on file   Health Maintenance  Topic Date Due   COVID-19 Vaccine (1) Never done   HPV VACCINES (1 - 2-dose series) Never done   INFLUENZA VACCINE  Never done   PAP-Cervical Cytology Screening  08/12/2023   PAP SMEAR-Modifier  08/12/2023   TETANUS/TDAP  04/10/2026   Hepatitis C Screening  Completed   HIV Screening  Completed       Review of Systems Pertinent items noted in HPI and remainder of comprehensive ROS otherwise negative.   Objective:  Blood pressure 134/83, pulse 79, height 5\' 4"  (1.626 m), weight 196 lb (88.9 kg), unknown if currently breastfeeding.   GENERAL: Well-developed, well-nourished female in no acute distress.  HEENT: Normocephalic, atraumatic. Sclerae anicteric.  NECK: Supple. Normal thyroid.  LUNGS: Clear to auscultation bilaterally.  HEART: Regular rate and rhythm. BREASTS: Symmetric in size. No palpable masses or lymphadenopathy, skin changes, or nipple drainage. ABDOMEN: Soft, nontender, nondistended. No organomegaly. PELVIC: Normal external female genitalia. Vagina is pink and rugated.  Normal discharge. Normal appearing cervix. Uterus is normal in size. No adnexal mass or tenderness. Chaperone present during the pelvic exam EXTREMITIES: No cyanosis, clubbing, or edema, 2+ distal pulses.     Assessment:    Healthy female exam.      Plan:    Pap smear collected STI screening per patient request Patient will be contacted regarding abnormal results Refill on  depo-provera provided Information on Mirena IUD provided See After Visit Summary for Counseling Recommendations

## 2022-06-15 LAB — HEPATITIS C ANTIBODY: Hep C Virus Ab: NONREACTIVE

## 2022-06-15 LAB — RPR: RPR Ser Ql: NONREACTIVE

## 2022-06-15 LAB — HEPATITIS B SURFACE ANTIGEN: Hepatitis B Surface Ag: NEGATIVE

## 2022-06-15 LAB — HIV ANTIBODY (ROUTINE TESTING W REFLEX): HIV Screen 4th Generation wRfx: NONREACTIVE

## 2022-06-16 LAB — CERVICOVAGINAL ANCILLARY ONLY
Bacterial Vaginitis (gardnerella): POSITIVE — AB
Candida Glabrata: NEGATIVE
Candida Vaginitis: POSITIVE — AB
Chlamydia: NEGATIVE
Comment: NEGATIVE
Comment: NEGATIVE
Comment: NEGATIVE
Comment: NEGATIVE
Comment: NEGATIVE
Comment: NORMAL
Neisseria Gonorrhea: NEGATIVE
Trichomonas: NEGATIVE

## 2022-06-17 MED ORDER — METRONIDAZOLE 500 MG PO TABS: 500.0000 mg | ORAL_TABLET | Freq: Two times a day (BID) | ORAL | 0 refills | Status: DC

## 2022-06-17 MED ORDER — FLUCONAZOLE 150 MG PO TABS: 150.0000 mg | ORAL_TABLET | Freq: Once | ORAL | 0 refills | Status: AC

## 2022-06-17 NOTE — Addendum Note (Signed)
Addended by: Mora Bellman on: 06/17/2022 10:19 AM   Modules accepted: Orders

## 2022-06-18 LAB — CYTOLOGY - PAP: Adequacy: ABSENT

## 2022-06-24 ENCOUNTER — Telehealth: Payer: Self-pay

## 2022-06-24 NOTE — Telephone Encounter (Signed)
S/w pt and advised of results and need for colpo. Advised that scheduler will call back for appt, pt agreed.

## 2022-09-04 ENCOUNTER — Ambulatory Visit (INDEPENDENT_AMBULATORY_CARE_PROVIDER_SITE_OTHER): Payer: Medicaid Other

## 2022-09-04 ENCOUNTER — Ambulatory Visit: Payer: Medicaid Other

## 2022-09-04 VITALS — BP 112/72 | HR 62 | Ht 64.0 in | Wt 190.0 lb

## 2022-09-04 DIAGNOSIS — Z3042 Encounter for surveillance of injectable contraceptive: Secondary | ICD-10-CM | POA: Diagnosis not present

## 2022-09-04 MED ORDER — MEDROXYPROGESTERONE ACETATE 150 MG/ML IM SUSP
150.0000 mg | Freq: Once | INTRAMUSCULAR | Status: AC
  Administered 2022-09-04: 150 mg via INTRAMUSCULAR

## 2022-09-04 NOTE — Progress Notes (Addendum)
  Date last pap: 06/14/2022  LSIL.  Last Depo-Provera: 06/14/2022.  Side Effects if any: None. Serum HCG indicated? NA. Depo-Provera 150 mg IM given by: Lum Babe, RMA.  Next appointment due Feb. 28 - December 05, 2022.   Administrations This Visit     medroxyPROGESTERone (DEPO-PROVERA) injection 150 mg     Admin Date 09/04/2022 Action Given Dose 150 mg Route Intramuscular Administered By Maretta Bees, RMA

## 2022-10-03 ENCOUNTER — Encounter: Payer: Self-pay | Admitting: Obstetrics and Gynecology

## 2022-10-03 ENCOUNTER — Other Ambulatory Visit (HOSPITAL_COMMUNITY)
Admission: RE | Admit: 2022-10-03 | Discharge: 2022-10-03 | Disposition: A | Discharge status: Home / Self Care | Payer: Medicaid Other | Source: Ambulatory Visit | Attending: Obstetrics and Gynecology | Admitting: Obstetrics and Gynecology

## 2022-10-03 ENCOUNTER — Ambulatory Visit (INDEPENDENT_AMBULATORY_CARE_PROVIDER_SITE_OTHER): Payer: Medicaid Other | Admitting: Obstetrics and Gynecology

## 2022-10-03 VITALS — BP 116/77 | HR 96 | Ht 64.0 in | Wt 194.0 lb

## 2022-10-03 DIAGNOSIS — R87612 Low grade squamous intraepithelial lesion on cytologic smear of cervix (LGSIL): Secondary | ICD-10-CM | POA: Insufficient documentation

## 2022-10-03 DIAGNOSIS — Z3202 Encounter for pregnancy test, result negative: Secondary | ICD-10-CM

## 2022-10-03 DIAGNOSIS — N870 Mild cervical dysplasia: Secondary | ICD-10-CM | POA: Diagnosis not present

## 2022-10-03 LAB — POCT URINE PREGNANCY: Preg Test, Ur: NEGATIVE

## 2022-10-03 NOTE — Progress Notes (Signed)
Pt presents for Colpo. LSIL on 9/22 pap. UPT negative

## 2022-10-03 NOTE — Progress Notes (Signed)
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 Patient is without any other complaints  Past Medical History:  Diagnosis Date   Infection    UTI   PONV (postoperative nausea and vomiting)    after C section   Past Surgical History:  Procedure Laterality Date   CESAREAN SECTION N/A 03/27/2015   Procedure: CESAREAN SECTION;  Surgeon: Shelly Bombard, MD;  Location: [place] [facility];  Service: Obstetrics;  Laterality: N/A;   CESAREAN SECTION N/A 04/08/2016   Procedure: CESAREAN SECTION;  Surgeon: Aletha Halim, MD;  Location: [place];  Service: Obstetrics;  Laterality: N/A;   CHOLECYSTECTOMY N/A 01/06/2018   Procedure: LAPAROSCOPIC CHOLECYSTECTOMY WITH INTRAOPERATIVE CHOLANGIOGRAM;  Surgeon: Donnie Mesa, MD;  Location: [place];  Service: General;  Laterality: N/A;   WISDOM TOOTH EXTRACTION     Family History  Problem Relation Age of Onset   Hypertension Mother    Social History   Tobacco Use   Smoking status: Never    Passive exposure: Never   Smokeless tobacco: Never  Substance Use Topics   Alcohol use: Yes    Alcohol/week: 1.0 standard drink of alcohol    Types: 1 Shots of liquor per week    Comment: weekends   Drug use: No   ROS See pertinent in HPI. All other systems reviewed and non contributory Blood pressure 116/77, pulse 96, height 5\' 4"  (1.626 m), weight 194 lb (88 kg), unknown if currently breastfeeding. GENERAL: Well-developed, well-nourished female in no acute distress.  PELVIC: Normal external female genitalia. Vagina is pink and rugated.  Normal discharge. Normal appearing cervix.  Chaperone present during the pelvic exam EXTREMITIES: No cyanosis, clubbing, or edema, 2+ distal pulses.  A/P Patient with LGSIL here for colposcopy Patient given informed consent, signed copy in the chart, time out was performed.  Placed in lithotomy position. Cervix viewed with speculum and colposcope after application of acetic acid.   Colposcopy adequate?   yes Acetowhite lesions?8 o'clock lesion Punctation?no Mosaicism?  no Abnormal vasculature?  no Biopsies?8 o'clock ECC?yes  COMMENTS: Patient was given post procedure instructions.  She will return in 2 weeks for results. Discussed benefits of Gardasil vaccine and patient will research before receiving it  Mora Bellman, MD

## 2022-10-07 LAB — SURGICAL PATHOLOGY

## 2022-10-10 NOTE — Progress Notes (Signed)
Pt advised of results and recommendation for repeat Pap with option for cryotherapy. Pt desires cryotherapy and would also like to start guardasil series. Call transferred to schedulers.

## 2022-11-27 ENCOUNTER — Ambulatory Visit: Payer: Medicaid Other

## 2022-12-09 ENCOUNTER — Ambulatory Visit: Payer: Medicaid Other | Admitting: Obstetrics and Gynecology

## 2022-12-11 ENCOUNTER — Ambulatory Visit: Payer: Medicaid Other | Admitting: Obstetrics and Gynecology

## 2022-12-30 ENCOUNTER — Ambulatory Visit (INDEPENDENT_AMBULATORY_CARE_PROVIDER_SITE_OTHER): Payer: Medicaid Other | Admitting: Obstetrics and Gynecology

## 2022-12-30 ENCOUNTER — Encounter: Payer: Self-pay | Admitting: Obstetrics and Gynecology

## 2022-12-30 VITALS — BP 124/80 | HR 80 | Ht 64.0 in | Wt 174.9 lb

## 2022-12-30 DIAGNOSIS — Z23 Encounter for immunization: Secondary | ICD-10-CM

## 2022-12-30 DIAGNOSIS — Z01812 Encounter for preprocedural laboratory examination: Secondary | ICD-10-CM | POA: Diagnosis not present

## 2022-12-30 DIAGNOSIS — Z3202 Encounter for pregnancy test, result negative: Secondary | ICD-10-CM

## 2022-12-30 DIAGNOSIS — R87612 Low grade squamous intraepithelial lesion on cytologic smear of cervix (LGSIL): Secondary | ICD-10-CM

## 2022-12-30 LAB — POCT URINE PREGNANCY: Preg Test, Ur: NEGATIVE

## 2022-12-30 NOTE — Progress Notes (Signed)
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 She was counseled about that efficacy of this procedure, and possible need for excisional procedure in the future if her cervical dysplasia persists.  The risks of the procedure where explained in detail and patient was told to expect a copious amount of discharge in the next few weeks. All her questions were answered, and written informed consent was obtained.  The patient was placed in the dorsal lithotomy position and a vaginal speculum was placed. Her cervix was visualized and patient was noted to have had normal size transformation zone. The appropriate cryotherapy probe was picked and affixed to cryotherapy apparatus. Then nitrogen gas was then activated, the probe was coated with lubricating jelly and applied to the transformation zone of the cervix. This was kept in place for 3 minutes. The cryotherapy was then stopped and all instruments were removed from the patient's pelvis; a thawing period of 3 minutes was observed.  A second cycle of cryotherapy was then administered to the cervix for 3 minutes.  The patient tolerated the procedure well without any complications. Routine post procedure instructions were given to the patient.  Will follow up results and manage accordingly to ASCCP guidelines which specify co-testing with pap and HPV typing at 12 and 24 months.  Discussed benefits of Gardasil vaccine which patient agreed to start today

## 2023-03-03 ENCOUNTER — Ambulatory Visit (INDEPENDENT_AMBULATORY_CARE_PROVIDER_SITE_OTHER): Payer: Medicaid Other | Admitting: Emergency Medicine

## 2023-03-03 VITALS — BP 126/76 | HR 56 | Ht 64.0 in | Wt 172.5 lb

## 2023-03-03 DIAGNOSIS — Z23 Encounter for immunization: Secondary | ICD-10-CM | POA: Diagnosis not present

## 2023-03-03 NOTE — Progress Notes (Addendum)
Pt presents for Gardasil #2 vaccine. Given into RD, tolerated well.

## 2023-07-03 ENCOUNTER — Ambulatory Visit: Payer: Medicaid Other

## 2023-07-07 ENCOUNTER — Ambulatory Visit: Payer: Medicaid Other

## 2023-07-07 NOTE — Progress Notes (Signed)
Error

## 2023-07-18 ENCOUNTER — Other Ambulatory Visit: Payer: Self-pay

## 2023-07-18 NOTE — Patient Instructions (Signed)
  Medicaid Managed Care   Unsuccessful Outreach Note  07/18/2023 Name: Rachel Aguirre MRN: [id number] DOB: Feb 08, 1996  Referred by: Patient, No Pcp Per Reason for referral : High Risk Managed Medicaid (MM social work unsuccessful telephone outreach )   An unsuccessful telephone outreach was attempted today. The patient was referred to the case management team for assistance with care management and care coordination.   Follow Up Plan: A HIPAA compliant phone message was left for the patient providing contact information and requesting a return call.   Abelino Derrick, MHA [facility]  Managed [facility] Social Worker [phone]

## 2023-07-18 NOTE — Patient Outreach (Signed)
  Medicaid Managed Care   Unsuccessful Outreach Note  07/18/2023 Name: Rachel Aguirre MRN: [id number] DOB: Feb 08, 1996  Referred by: Patient, No Pcp Per Reason for referral : High Risk Managed Medicaid (MM social work unsuccessful telephone outreach )   An unsuccessful telephone outreach was attempted today. The patient was referred to the case management team for assistance with care management and care coordination.   Follow Up Plan: A HIPAA compliant phone message was left for the patient providing contact information and requesting a return call.   Abelino Derrick, MHA [facility]  Managed [facility] Social Worker [phone]

## 2023-07-25 ENCOUNTER — Telehealth: Payer: Self-pay

## 2023-07-25 DIAGNOSIS — Z419 Encounter for procedure for purposes other than remedying health state, unspecified: Secondary | ICD-10-CM | POA: Diagnosis not present

## 2023-07-25 NOTE — Telephone Encounter (Signed)
..   Medicaid Managed Care   Unsuccessful Outreach Note  07/25/2023 Name: Rachel Aguirre MRN: [id number] DOB: 10/24/1995  Referred by: Patient, No Pcp Per Reason for referral : Appointment (I called the patient today to get her missed phone appointment rescheduled with the [facility] BSW. She did not answer. I left a message on her VM.)   A second unsuccessful telephone outreach was attempted today. The patient was referred to the case management team for assistance with care management and care coordination.   Follow Up Plan: A HIPAA compliant phone message was left for the patient providing contact information and requesting a return call.  The care management team will reach out to the patient again over the next 7 days.   Weston Settle Care Guide  [facility] Managed  Care Guide [facility]  [phone]

## 2023-07-29 ENCOUNTER — Telehealth: Payer: Self-pay

## 2023-07-29 NOTE — Telephone Encounter (Signed)
..   Medicaid Managed Care   Unsuccessful Outreach Note  07/29/2023 Name: Rachel Aguirre MRN: [id number] DOB: 02/01/1996  Referred by: Patient, No Pcp Per Reason for referral : Appointment   Third unsuccessful telephone outreach was attempted today. The patient was referred to the case management team for assistance with care management and care coordination. The patient's primary care provider has been notified of our unsuccessful attempts to make or maintain contact with the patient. The care management team is pleased to engage with this patient at any time in the future should he/she be interested in assistance from the care management team.   Follow Up Plan: We have been unable to make contact with the patient for follow up. The care management team is available to follow up with the patient after provider conversation with the patient regarding recommendation for care management engagement and subsequent re-referral to the care management team.   Weston Settle Care Guide  [facility] Managed  Care Guide [address] [facility]  [phone]

## 2023-08-24 DIAGNOSIS — Z419 Encounter for procedure for purposes other than remedying health state, unspecified: Secondary | ICD-10-CM | POA: Diagnosis not present

## 2023-09-24 DIAGNOSIS — Z419 Encounter for procedure for purposes other than remedying health state, unspecified: Secondary | ICD-10-CM | POA: Diagnosis not present

## 2023-10-13 ENCOUNTER — Ambulatory Visit: Payer: Medicaid Other | Admitting: Advanced Practice Midwife

## 2023-10-13 ENCOUNTER — Encounter: Payer: Self-pay | Admitting: Advanced Practice Midwife

## 2023-10-13 ENCOUNTER — Other Ambulatory Visit (HOSPITAL_COMMUNITY)
Admission: RE | Admit: 2023-10-13 | Discharge: 2023-10-13 | Disposition: A | Discharge status: Home / Self Care | Payer: Medicaid Other | Source: Ambulatory Visit | Attending: Advanced Practice Midwife | Admitting: Advanced Practice Midwife

## 2023-10-13 VITALS — BP 123/85 | HR 77 | Ht 64.0 in | Wt 180.1 lb

## 2023-10-13 DIAGNOSIS — Z124 Encounter for screening for malignant neoplasm of cervix: Secondary | ICD-10-CM | POA: Diagnosis not present

## 2023-10-13 DIAGNOSIS — Z23 Encounter for immunization: Secondary | ICD-10-CM | POA: Diagnosis not present

## 2023-10-13 DIAGNOSIS — Z113 Encounter for screening for infections with a predominantly sexual mode of transmission: Secondary | ICD-10-CM | POA: Diagnosis not present

## 2023-10-13 DIAGNOSIS — R87612 Low grade squamous intraepithelial lesion on cytologic smear of cervix (LGSIL): Secondary | ICD-10-CM | POA: Insufficient documentation

## 2023-10-13 DIAGNOSIS — N760 Acute vaginitis: Secondary | ICD-10-CM

## 2023-10-13 DIAGNOSIS — Z01419 Encounter for gynecological examination (general) (routine) without abnormal findings: Secondary | ICD-10-CM

## 2023-10-13 DIAGNOSIS — G8929 Other chronic pain: Secondary | ICD-10-CM

## 2023-10-13 DIAGNOSIS — B3731 Acute candidiasis of vulva and vagina: Secondary | ICD-10-CM

## 2023-10-13 DIAGNOSIS — B9689 Other specified bacterial agents as the cause of diseases classified elsewhere: Secondary | ICD-10-CM

## 2023-10-13 DIAGNOSIS — R102 Pelvic and perineal pain: Secondary | ICD-10-CM | POA: Diagnosis not present

## 2023-10-13 DIAGNOSIS — Z1151 Encounter for screening for human papillomavirus (HPV): Secondary | ICD-10-CM

## 2023-10-13 NOTE — Progress Notes (Unsigned)
Subjective:     Rachel Aguirre is a 28 y.o. female here at [facility] for a routine exam.  Pt reports regular monthly menses.  Current complaints: Pelvic Pain. Pain in LLQ, intermittent, with movement, chronic, since cesarean delivery.  Personal and family health history reviewed: yes.    Flowsheet Row Postpartum Visit from 05/28/2016 in [facility]  PHQ-2 Total Score 1       Health Maintenance Due  Topic Date Due   INFLUENZA VACCINE  Never done   COVID-19 Vaccine (1 - 2024-25 season) Never done   HPV VACCINES (3 - 3-dose SCDM series) 07/01/2023     Risk factors for chronic health problems: Smoking: Never Alchohol/how much: 1 drink /week Pt BMI: Body mass index is 30.91 kg/m.   Gynecologic History Patient's last menstrual period was 09/24/2023 (exact date). Contraception: not assessed Last Pap: 10/03/2022. Results were: abnormal, LSIL, colpo CIN1, repeat in 1 year Last mammogram: n/a.   Obstetric History OB History  Gravida Para Term Preterm AB Living  4 2 2  1 2   SAB IAB Ectopic Multiple Live Births     0 2    # Outcome Date GA Lbr Len/2nd Weight Sex Type Anes PTL Lv  4 Gravida           3 AB 05/05/18 [redacted]w[redacted]d    TAB     2 Term 04/08/16 [redacted]w[redacted]d  7 lb 12.7 oz (3.535 kg) F CS-Vac EPI  LIV     Birth Comments: Low transverse hysterotomy repaired in multiple layers. Surgeon recommends in op not to consider treating as a classical with future deliveries @ 37 wks  1 Term 03/27/15 [redacted]w[redacted]d  6 lb 8.8 oz (2.97 kg) F CS-LTranv Spinal  LIV     The following portions of the patient's history were reviewed and updated as appropriate: allergies, current medications, past family history, past medical history, past social history, past surgical history, and problem list.  Review of Systems Pertinent items noted in HPI and remainder of comprehensive ROS otherwise negative.    Objective:  BP 123/85   Pulse 77   Ht 5\' 4"  (1.626 m)   Wt 180 lb 1.6 oz (81.7 kg)   LMP 09/24/2023  (Exact Date)   BMI 30.91 kg/m   VS reviewed, nursing note reviewed,  Constitutional: well developed, well nourished, no distress HEENT: normocephalic, thyroid without enlargement or mass HEART: regular rate RESP: clear and unlabored Breast Exam:  exam performed: right breast normal without mass, skin or nipple changes or axillary nodes, left breast normal without mass, skin or nipple changes or axillary nodes Abdomen: soft Neuro: alert and oriented x 3 Skin: warm, dry Psych: affect normal Pelvic exam: Performed: Cervix pink, visually closed, without lesion, scant white creamy discharge, vaginal walls and external genitalia normal Bimanual exam: Cervix 0/long/high, firm, anterior, neg CMT, uterus nontender, nonenlarged, adnexa without tenderness, enlargement, or mass        Assessment/Plan:   1. Encounter for annual routine gynecological examination  2. 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 Chronic pelvic pain in female --Pain c/w scar tissue from cesarean, no menstrual or bowel problems, normal pelvic exam. --Outpatient pelvic US ordered - Ambulatory referral to Physical Therapy  4. Screening for cervical cancer --Pap  No follow-ups on file.   Sharen Counter, CNM 12:35 PM

## 2023-10-13 NOTE — Progress Notes (Signed)
Denies s/s bladder infection. Pain lower pelvic area. Has taken OTC for it but comes back. Not interested in [facility] or STI testing today.

## 2023-10-14 LAB — HEPATITIS B SURFACE ANTIGEN: Hepatitis B Surface Ag: NEGATIVE

## 2023-10-14 LAB — HIV ANTIBODY (ROUTINE TESTING W REFLEX): HIV Screen 4th Generation wRfx: NONREACTIVE

## 2023-10-14 LAB — RPR: RPR Ser Ql: NONREACTIVE

## 2023-10-14 LAB — HEPATITIS C ANTIBODY: Hep C Virus Ab: NONREACTIVE

## 2023-10-15 LAB — CERVICOVAGINAL ANCILLARY ONLY
Bacterial Vaginitis (gardnerella): POSITIVE — AB
Candida Glabrata: NEGATIVE
Candida Vaginitis: POSITIVE — AB
Chlamydia: NEGATIVE
Comment: NEGATIVE
Comment: NEGATIVE
Comment: NEGATIVE
Comment: NEGATIVE
Comment: NEGATIVE
Comment: NORMAL
Neisseria Gonorrhea: NEGATIVE
Trichomonas: NEGATIVE

## 2023-10-17 ENCOUNTER — Encounter: Payer: Self-pay | Admitting: Advanced Practice Midwife

## 2023-10-17 MED ORDER — FLUCONAZOLE 150 MG PO TABS
ORAL_TABLET | ORAL | 0 refills | Status: AC
Start: 2023-10-17 — End: ?

## 2023-10-17 MED ORDER — METRONIDAZOLE 500 MG PO TABS: 500.0000 mg | ORAL_TABLET | Freq: Two times a day (BID) | ORAL | 0 refills | Status: AC

## 2023-10-20 ENCOUNTER — Ambulatory Visit (HOSPITAL_BASED_OUTPATIENT_CLINIC_OR_DEPARTMENT_OTHER): Admission: RE | Admit: 2023-10-20 | Payer: Medicaid Other | Source: Ambulatory Visit

## 2023-10-20 LAB — CYTOLOGY - PAP
Adequacy: ABSENT
Diagnosis: NEGATIVE

## 2023-10-25 DIAGNOSIS — Z419 Encounter for procedure for purposes other than remedying health state, unspecified: Secondary | ICD-10-CM | POA: Diagnosis not present

## 2023-10-29 ENCOUNTER — Ambulatory Visit: Payer: Medicaid Other | Attending: Advanced Practice Midwife | Admitting: Physical Therapy

## 2023-11-22 DIAGNOSIS — Z419 Encounter for procedure for purposes other than remedying health state, unspecified: Secondary | ICD-10-CM | POA: Diagnosis not present

## 2023-12-29 DIAGNOSIS — T161XXA Foreign body in right ear, initial encounter: Secondary | ICD-10-CM | POA: Diagnosis not present

## 2024-01-03 DIAGNOSIS — Z419 Encounter for procedure for purposes other than remedying health state, unspecified: Secondary | ICD-10-CM | POA: Diagnosis not present

## 2024-02-02 DIAGNOSIS — Z419 Encounter for procedure for purposes other than remedying health state, unspecified: Secondary | ICD-10-CM | POA: Diagnosis not present

## 2024-03-04 DIAGNOSIS — Z419 Encounter for procedure for purposes other than remedying health state, unspecified: Secondary | ICD-10-CM | POA: Diagnosis not present

## 2024-03-30 ENCOUNTER — Ambulatory Visit

## 2024-03-30 ENCOUNTER — Other Ambulatory Visit (HOSPITAL_COMMUNITY)
Admission: RE | Admit: 2024-03-30 | Discharge: 2024-03-30 | Disposition: A | Discharge status: Home / Self Care | Source: Ambulatory Visit | Attending: Obstetrics & Gynecology | Admitting: Obstetrics & Gynecology

## 2024-03-30 VITALS — BP 117/77 | HR 64

## 2024-03-30 DIAGNOSIS — Z3202 Encounter for pregnancy test, result negative: Secondary | ICD-10-CM

## 2024-03-30 DIAGNOSIS — N898 Other specified noninflammatory disorders of vagina: Secondary | ICD-10-CM | POA: Diagnosis not present

## 2024-03-30 DIAGNOSIS — Z3200 Encounter for pregnancy test, result unknown: Secondary | ICD-10-CM

## 2024-03-30 LAB — POCT URINE PREGNANCY: Preg Test, Ur: NEGATIVE

## 2024-03-30 NOTE — Progress Notes (Signed)
 SUBJECTIVE:  28 y.o. female complains of Rachel Aguirre, creamy vaginal discharge for 1 week(s). Denies vaginal odor, abnormal vaginal bleeding or significant pelvic pain or fever. No UTI symptoms. Denies history of known exposure to STD.  No LMP recorded (lmp unknown). Pt requesting POCT pregnancy test.   OBJECTIVE:  She appears well, afebrile. Urine dipstick: not done.  ASSESSMENT:  Vaginal Discharge     PLAN:  GC, chlamydia, trichomonas, BVAG, CVAG probe sent to lab. Treatment: To be determined once lab results are received ROV prn if symptoms persist or worsen.

## 2024-03-31 LAB — CERVICOVAGINAL ANCILLARY ONLY
Bacterial Vaginitis (gardnerella): POSITIVE — AB
Candida Glabrata: NEGATIVE
Candida Vaginitis: POSITIVE — AB
Chlamydia: NEGATIVE
Comment: NEGATIVE
Comment: NEGATIVE
Comment: NEGATIVE
Comment: NEGATIVE
Comment: NEGATIVE
Comment: NORMAL
Neisseria Gonorrhea: NEGATIVE
Trichomonas: NEGATIVE

## 2024-04-03 DIAGNOSIS — Z419 Encounter for procedure for purposes other than remedying health state, unspecified: Secondary | ICD-10-CM | POA: Diagnosis not present

## 2024-04-07 ENCOUNTER — Other Ambulatory Visit: Payer: Self-pay

## 2024-04-07 DIAGNOSIS — N760 Acute vaginitis: Secondary | ICD-10-CM

## 2024-04-07 DIAGNOSIS — B379 Candidiasis, unspecified: Secondary | ICD-10-CM

## 2024-04-07 MED ORDER — FLUCONAZOLE 150 MG PO TABS: 150.0000 mg | ORAL_TABLET | Freq: Once | ORAL | 0 refills | Status: AC

## 2024-04-07 MED ORDER — METRONIDAZOLE 500 MG PO TABS: 500.0000 mg | ORAL_TABLET | Freq: Two times a day (BID) | ORAL | 0 refills | Status: AC

## 2024-05-04 DIAGNOSIS — Z419 Encounter for procedure for purposes other than remedying health state, unspecified: Secondary | ICD-10-CM | POA: Diagnosis not present

## 2024-06-04 DIAGNOSIS — Z419 Encounter for procedure for purposes other than remedying health state, unspecified: Secondary | ICD-10-CM | POA: Diagnosis not present

## 2024-07-04 DIAGNOSIS — Z419 Encounter for procedure for purposes other than remedying health state, unspecified: Secondary | ICD-10-CM | POA: Diagnosis not present
